# Patient Record
Sex: Male | Born: 1980 | Race: White | Hispanic: No | Marital: Single | Smoking: Former smoker
Health system: Southern US, Community
[De-identification: ages and names within clinical notes are randomized; demographics above are authoritative.]

## PROBLEM LIST (undated history)

## (undated) DIAGNOSIS — R7989 Other specified abnormal findings of blood chemistry: Secondary | ICD-10-CM

## (undated) DIAGNOSIS — E739 Lactose intolerance, unspecified: Secondary | ICD-10-CM

## (undated) DIAGNOSIS — F32A Depression, unspecified: Secondary | ICD-10-CM

## (undated) DIAGNOSIS — F419 Anxiety disorder, unspecified: Secondary | ICD-10-CM

## (undated) DIAGNOSIS — Z8619 Personal history of other infectious and parasitic diseases: Secondary | ICD-10-CM

## (undated) HISTORY — DX: Depression, unspecified: F32.A

## (undated) HISTORY — DX: Anxiety disorder, unspecified: F41.9

## (undated) HISTORY — PX: WISDOM TOOTH EXTRACTION: SHX21

## (undated) HISTORY — DX: Other specified abnormal findings of blood chemistry: R79.89

## (undated) HISTORY — DX: Lactose intolerance, unspecified: E73.9

## (undated) HISTORY — DX: Personal history of other infectious and parasitic diseases: Z86.19

---
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
## 2012-07-17 ENCOUNTER — Ambulatory Visit (INDEPENDENT_AMBULATORY_CARE_PROVIDER_SITE_OTHER): Payer: BC Managed Care – PPO | Admitting: Family

## 2012-07-17 ENCOUNTER — Encounter: Payer: Self-pay | Admitting: Family

## 2012-07-17 VITALS — BP 90/70 | HR 68 | Temp 98.1°F | Resp 16 | Ht 70.5 in | Wt 139.0 lb

## 2012-07-17 DIAGNOSIS — F329 Major depressive disorder, single episode, unspecified: Secondary | ICD-10-CM | POA: Insufficient documentation

## 2012-07-17 DIAGNOSIS — E785 Hyperlipidemia, unspecified: Secondary | ICD-10-CM

## 2012-07-17 DIAGNOSIS — K589 Irritable bowel syndrome without diarrhea: Secondary | ICD-10-CM | POA: Insufficient documentation

## 2012-07-17 DIAGNOSIS — F4321 Adjustment disorder with depressed mood: Secondary | ICD-10-CM

## 2012-07-17 DIAGNOSIS — R197 Diarrhea, unspecified: Secondary | ICD-10-CM

## 2012-07-17 NOTE — Assessment & Plan Note (Signed)
Will refer to a therapist to help him. He is instructed to go directly to the ED if he develops suicide ideation and to contact us if he thinks his symptoms are worsening.  Will hold off on meds for now.  30 minutes spent with pt today.  >50% of this time was spent counseling pt on his grief reaction.

## 2012-07-17 NOTE — Progress Notes (Signed)
Subjective:    Patient ID: Manuel Patton, male    DOB: Apr 09, 1981, 31 y.o.   MRN: [id number]  HPI  [name] "Manuel Patton" is a 31 yr old male who presents today to establish care.  He reports that he is originally from [place], [place], but moved down here 7 years ago for work.  He presents today with chief complaint of chronic diarrhea.   Diarrhea- reports + lactose intolerant since 14.  He reports that he keeps a strict lactose free diet, but despite this, he never has formed stools.  Diary will make his diarrhea worse. Beginning of this year he felt that his diarrhea symptoms worsened.  He reports associated  +Gas/Bloating.  Symptoms occur daily.  Denies associated nausea, reflux symptoms, black or bloody stools.    He reports that late last year his girlfriend diet unexpectedly in a motor vehicle accident.  He has been struggling more recently.  Feeling somewhat indifferent to things. Finding less pleasure in hobbies etc.   Hyperlipidemia-  Never on medicaiton.    Smokes 2 cigars a year.    Review of Systems  Constitutional: Negative for unexpected weight change.  HENT: Negative for congestion.   Eyes: Negative for visual disturbance.  Respiratory: Negative for cough and shortness of breath.   Cardiovascular: Negative for chest pain.  Gastrointestinal: Positive for diarrhea.  Genitourinary: Negative for dysuria and frequency.  Musculoskeletal: Positive for arthralgias. Negative for myalgias.       Bilateral shoulder pain, worse after exercise  Skin: Negative for rash.  Neurological: Negative for headaches.  Hematological: Negative for adenopathy.  Psychiatric/Behavioral:       +anxiety- had panic attack a few months ago   Past Medical History  Diagnosis Date  . History of chicken pox   . Hyperlipidemia   . Hypertension     History   Social History  . Marital Status: Single    Spouse Name: N/A    Number of Children: N/A  . Years of Education: N/A   Occupational History   . Not on file.   Social History Main Topics  . Smoking status: Current Some Day Smoker  . Smokeless tobacco: Never Used   Comment: 2 cigars a year  . Alcohol Use: 0.5 - 1.0 oz/week    1-2 drink(s) per week  . Drug Use: Not on file  . Sexually Active: Not on file   Other Topics Concern  . Not on file   Social History Narrative   Regular exercise:  Roller blade/skating 2-3 x weeklyCaffeine use:  Occasional tea.No children,Completed high schoolWorks for [employer]- insurance managementGirlfriend died 08-Apr-2011 in MVA    Past Surgical History  Procedure Date  . Tonsillectomy and adenoidectomy 04-07-2000    Family History  Problem Relation Age of Onset  . Stroke Maternal Grandmother   . Hypertension Other   . Hyperlipidemia Other     Only knows that htn and hyperlipidemia run  on his dad's side    No Known Allergies  No current outpatient prescriptions on file prior to visit.    BP 90/70  Pulse 68  Temp 98.1 F (36.7 C) (Oral)  Resp 16  Ht 5' 10.5" (1.791 m)  Wt 139 lb 0.6 oz (63.068 kg)  BMI 19.67 kg/m2  SpO2 99%       Objective:   Physical Exam  Constitutional: He appears well-developed.       Thin white mail.  Cardiovascular: Normal rate and regular rhythm.   No murmur  heard. Pulmonary/Chest: Effort normal and breath sounds normal. No respiratory distress. He has no wheezes. He has no rales. He exhibits no tenderness.  Musculoskeletal: He exhibits no edema.  Lymphadenopathy:    He has no cervical adenopathy.  Psychiatric: He has a normal mood and affect. His behavior is normal. Judgment and thought content normal.          Assessment & Plan:

## 2012-07-17 NOTE — Patient Instructions (Addendum)
You will be contact about your referrals to see the therapist and to the Gastroenterologist.  Please let us know if you have not heard back within 1 week about your referrals. Schedule a fasting physical at your convenience. Welcome to [facility]!

## 2012-07-17 NOTE — Assessment & Plan Note (Signed)
I suspect IBS, especially since it has worsened under stress.  Gluten intolerance is another possibility.  Will refer to GI for further evaluation. Recommended prn imodium for now.

## 2012-07-17 NOTE — Assessment & Plan Note (Signed)
Plan to check FLP next visit.

## 2012-07-30 ENCOUNTER — Ambulatory Visit (INDEPENDENT_AMBULATORY_CARE_PROVIDER_SITE_OTHER): Payer: BC Managed Care – PPO | Admitting: Family

## 2012-07-30 ENCOUNTER — Encounter: Payer: Self-pay | Admitting: Family

## 2012-07-30 VITALS — BP 100/74 | HR 64 | Temp 98.1°F | Resp 16 | Ht 70.5 in | Wt 135.0 lb

## 2012-07-30 DIAGNOSIS — F4321 Adjustment disorder with depressed mood: Secondary | ICD-10-CM

## 2012-07-30 DIAGNOSIS — R197 Diarrhea, unspecified: Secondary | ICD-10-CM

## 2012-07-30 DIAGNOSIS — Z0000 Encounter for general adult medical examination without abnormal findings: Secondary | ICD-10-CM

## 2012-07-30 LAB — CBC WITH DIFFERENTIAL/PLATELET
Basophils Absolute: 0 10*3/uL (ref 0.0–0.1)
Eosinophils Relative: 1 % (ref 0–5)
Lymphocytes Relative: 22 % (ref 12–46)
Lymphs Abs: 1.1 10*3/uL (ref 0.7–4.0)
MCV: 89 fL (ref 78.0–100.0)
Neutrophils Relative %: 70 % (ref 43–77)
Platelets: 311 10*3/uL (ref 150–400)
RBC: 4.99 MIL/uL (ref 4.22–5.81)
RDW: 12 % (ref 11.5–15.5)
WBC: 5.3 10*3/uL (ref 4.0–10.5)

## 2012-07-30 LAB — HEPATIC FUNCTION PANEL
ALT: 14 U/L (ref 0–53)
AST: 16 U/L (ref 0–37)
Bilirubin, Direct: 0.2 mg/dL (ref 0.0–0.3)
Indirect Bilirubin: 0.8 mg/dL (ref 0.0–0.9)
Total Protein: 7.6 g/dL (ref 6.0–8.3)

## 2012-07-30 LAB — LIPID PANEL
Cholesterol: 163 mg/dL (ref 0–200)
LDL Cholesterol: 97 mg/dL (ref 0–99)
Total CHOL/HDL Ratio: 3.2 Ratio
VLDL: 15 mg/dL (ref 0–40)

## 2012-07-30 NOTE — Progress Notes (Signed)
Subjective:    Patient ID: Manuel Patton, male    DOB: [birth year], 31 y.o.   MRN: [id number]  HPI  Patient presents today for complete physical.  Immunizations: declines flu shot.  Tetanus is up to date. Diet: not eating enough.   Exercise: Rollerblades [employer])  Review of Systems  Constitutional: Negative for unexpected weight change.  HENT: Negative for congestion.   Eyes: Negative for visual disturbance.  Respiratory: Negative for cough.   Cardiovascular: Negative for chest pain.  Gastrointestinal:       + diarrhea/bloating.  Genitourinary: Negative for frequency.  Musculoskeletal:       Some shoulder neck pain.    Skin: Negative for rash.  Neurological: Negative for headaches.  Hematological: Negative for adenopathy.  Psychiatric/Behavioral:       Fair mood   Past Medical History  Diagnosis Date  . History of chicken pox   . Hyperlipidemia   . Hypertension     History   Social History  . Marital Status: Single    Spouse Name: N/A    Number of Children: N/A  . Years of Education: N/A   Occupational History  . Not on file.   Social History Main Topics  . Smoking status: Current Some Day Smoker  . Smokeless tobacco: Never Used   Comment: 2 cigars a year  . Alcohol Use: 0.5 - 1.0 oz/week    1-2 drink(s) per week  . Drug Use: Not on file  . Sexually Active: Not on file   Other Topics Concern  . Not on file   Social History Narrative   Regular exercise:  Roller blade/skating 2-3 x weeklyCaffeine use:  Occasional tea.No children,Completed high schoolWorks for [employer]- insurance managementGirlfriend died 03-15-11 in MVA    Past Surgical History  Procedure Date  . Tonsillectomy and adenoidectomy 03/14/00    Family History  Problem Relation Age of Onset  . Stroke Maternal Grandmother   . Hypertension Other   . Hyperlipidemia Other     Only knows that htn and hyperlipidemia run  on his dad's side    No Known Allergies  No current outpatient  prescriptions on file prior to visit.    BP 100/74  Pulse 64  Temp 98.1 F (36.7 C) (Oral)  Resp 16  Ht 5' 10.5" (1.791 m)  Wt 135 lb 0.6 oz (61.254 kg)  BMI 19.10 kg/m2  SpO2 99%        Objective:   Physical Exam  Physical Exam  Constitutional: He is oriented to person, place, and time. He appears thin. No distress.  HENT:  Head: Normocephalic and atraumatic.  Right Ear: Tympanic membrane and ear canal normal.  Left Ear: Tympanic membrane and ear canal normal.  Mouth/Throat: Oropharynx is clear and moist.  Eyes: Pupils are equal, round, and reactive to light. No scleral icterus.  Neck: Normal range of motion. No thyromegaly present.  Cardiovascular: Normal rate and regular rhythm.   No murmur heard. Pulmonary/Chest: Effort normal and breath sounds normal. No respiratory distress. He has no wheezes. He has no rales. He exhibits no tenderness.  Abdominal: Soft. Bowel sounds are normal. He exhibits no distension and no mass. There is no tenderness. There is no rebound and no guarding.  Musculoskeletal: He exhibits no edema.  Lymphadenopathy:    He has no cervical adenopathy.  Neurological: He is alert and oriented to person, place, and time. He has normal reflexes. He exhibits normal muscle tone. Coordination normal.  Skin: Skin is warm  and dry.  Psychiatric: He has a normal mood and affect. His behavior is normal. Judgment and thought content normal.          Assessment & Plan:          Assessment & Plan:

## 2012-07-30 NOTE — Assessment & Plan Note (Signed)
Pt encouraged to exercise.  Obtain fasting lab work. Tetanus up to date.  Declines flu shot.

## 2012-07-30 NOTE — Assessment & Plan Note (Signed)
Unchanged. GI referral.

## 2012-07-30 NOTE — Patient Instructions (Addendum)
Please follow up in 3 months.  Complete your blood work prior to leaving.  

## 2012-07-31 ENCOUNTER — Encounter: Payer: Self-pay | Admitting: Family

## 2012-07-31 LAB — URINALYSIS, MICROSCOPIC ONLY
Casts: NONE SEEN
Squamous Epithelial / LPF: NONE SEEN

## 2012-07-31 LAB — BASIC METABOLIC PANEL WITH GFR
BUN: 12 mg/dL (ref 6–23)
CO2: 27 mEq/L (ref 19–32)
GFR, Est African American: >89 mL/min
Glucose, Bld: 84 mg/dL (ref 70–99)
Potassium: 4.1 mEq/L (ref 3.5–5.3)
Sodium: 141 mEq/L (ref 135–145)

## 2012-07-31 LAB — URINALYSIS, ROUTINE W REFLEX MICROSCOPIC
Leukocytes, UA: NEGATIVE
Nitrite: NEGATIVE
Protein, ur: NEGATIVE mg/dL
Specific Gravity, Urine: 1.028 (ref 1.005–1.030)
Urobilinogen, UA: 1 mg/dL (ref 0.0–1.0)

## 2012-08-01 ENCOUNTER — Encounter: Payer: Self-pay | Admitting: Internal Medicine

## 2012-08-22 ENCOUNTER — Ambulatory Visit (INDEPENDENT_AMBULATORY_CARE_PROVIDER_SITE_OTHER): Payer: BC Managed Care – PPO | Admitting: Licensed Clinical Social Worker

## 2012-08-22 DIAGNOSIS — F321 Major depressive disorder, single episode, moderate: Secondary | ICD-10-CM

## 2012-08-23 ENCOUNTER — Encounter: Payer: Self-pay | Admitting: Internal Medicine

## 2012-08-26 ENCOUNTER — Ambulatory Visit (INDEPENDENT_AMBULATORY_CARE_PROVIDER_SITE_OTHER): Payer: BC Managed Care – PPO | Admitting: Internal Medicine

## 2012-08-26 ENCOUNTER — Other Ambulatory Visit (INDEPENDENT_AMBULATORY_CARE_PROVIDER_SITE_OTHER): Payer: BC Managed Care – PPO

## 2012-08-26 ENCOUNTER — Encounter: Payer: Self-pay | Admitting: Internal Medicine

## 2012-08-26 VITALS — BP 108/62 | HR 75 | Ht 70.5 in | Wt 135.0 lb

## 2012-08-26 DIAGNOSIS — F4321 Adjustment disorder with depressed mood: Secondary | ICD-10-CM

## 2012-08-26 DIAGNOSIS — R109 Unspecified abdominal pain: Secondary | ICD-10-CM

## 2012-08-26 DIAGNOSIS — R197 Diarrhea, unspecified: Secondary | ICD-10-CM

## 2012-08-26 DIAGNOSIS — R195 Other fecal abnormalities: Secondary | ICD-10-CM

## 2012-08-26 DIAGNOSIS — R1030 Lower abdominal pain, unspecified: Secondary | ICD-10-CM

## 2012-08-26 LAB — IGA: IgA: 138 mg/dL (ref 68–378)

## 2012-08-26 MED ORDER — ALIGN PO CAPS: 1.0000 | ORAL_CAPSULE | Freq: Every day | ORAL | Status: DC

## 2012-08-26 NOTE — Patient Instructions (Signed)
Please follow up with Dr. Rhea Belton in 8-10 weeks  We have given you samples of Align. This puts good bacteria back into your colon. You should take 1 capsule by mouth once daily. If this works well for you, it can be purchased over the counter.  Please use Imodium over the counter as needed   Your physician has requested that you go to the basement for lab work before leaving today

## 2012-08-26 NOTE — Progress Notes (Signed)
Patient ID: Manuel Patton, male   DOB: Sep 20, 1981, 31 y.o.   MRN: [id number]  SUBJECTIVE: HPI Manuel Patton is a 31 yo male with little PMH other than lactose intolerance who seen in consultation at the request of Dr. Peggyann Juba for evaluation of lower abdominal cramping pain and loose stools. The patient states that he has a long history of lactose intolerance dating back to around age 65. He reports he follows a very strict lactose-free diet, and when and if he is exposed lactose he has lower abdominal cramping pain and diarrhea. He reports that he has had lower abdominal cramping pain and loose stools over the past year despite following a lactose-free diet. This is always worse after eating, but he is unaware of any particular trigger food. He does not have nocturnal symptoms. He has not noticed a major change in the frequency of his bowel movements, but rather a change in the consistency. He states his stools are also loose and occasionally liquid. There are times urgent. He has not seen any blood or melena. He does still experience lower abdominal cramping which is often relieved by bowel movement. This tends to be worse with stress. He did lose his girlfriend to motor vehicle accident in November of 2012.  He is recently seeing a therapist to discuss his grief and he reports after discussing it further he has noticed that his symptoms worsened around the time of her death. He also notes worsening anxiety since this time. He has occasionally use Pepto-Bismol but only on an as-needed basis. He has lost 8-10 pounds over the last year. No nausea or vomiting. Appetite has been fine. No heartburn, dysphagia or odynophagia. No rashes. No fever.  No camping or well water.  Review of Systems  As per history of present illness, otherwise negative   Past Medical History  Diagnosis Date  . History of chicken pox   . Lactose intolerance     Current Outpatient Prescriptions  Medication Sig Dispense Refill    . Bismuth Subsalicylate (PEPTO-BISMOL PO) Take by mouth as needed.        No Known Allergies  Family History  Problem Relation Age of Onset  . Stroke Maternal Grandmother   . Hypertension Other   . Hyperlipidemia Other     Only knows that htn and hyperlipidemia run  on his dad's side    History  Substance Use Topics  . Smoking status: Current Some Day Smoker  . Smokeless tobacco: Never Used   Comment: 2 cigars a year  . Alcohol Use: 0.5 - 1.0 oz/week    1-2 drink(s) per week    OBJECTIVE: BP 108/62  Pulse 75  Ht 5' 10.5" (1.791 m)  Wt 135 lb (61.236 kg)  BMI 19.10 kg/m2 Constitutional: Well-developed and well-nourished. No distress. HEENT: Normocephalic and atraumatic. Oropharynx is clear and moist. No oropharyngeal exudate. Conjunctivae are normal. No scleral icterus. Neck: Neck supple. Trachea midline. Cardiovascular: Normal rate, regular rhythm and intact distal pulses. No M/R/G Pulmonary/chest: Effort normal and breath sounds normal. No wheezing, rales or rhonchi. Abdominal: Soft, nontender, nondistended. Bowel sounds active throughout. There are no masses palpable. No hepatosplenomegaly. Extremities: no clubbing, cyanosis, or edema Lymphadenopathy: No cervical adenopathy noted. Neurological: Alert and oriented to person place and time. Skin: Skin is warm and dry. No rashes noted. Psychiatric: Normal mood and affect. Behavior is normal.  Labs and Imaging -- TSH normal  CBC    Component Value Date/Time   WBC 5.3 07/30/2012 0922  RBC 4.99 07/30/2012 0922   HGB 15.7 07/30/2012 0922   HCT 44.4 07/30/2012 0922   PLT 311 07/30/2012 0922   MCV 89.0 07/30/2012 0922   MCH 31.5 07/30/2012 0922   MCHC 35.4 07/30/2012 0922   RDW 12.0 07/30/2012 0922   LYMPHSABS 1.1 07/30/2012 0922   MONOABS 0.3 07/30/2012 0922   EOSABS 0.0 07/30/2012 0922   BASOSABS 0.0 07/30/2012 0922    CMP     Component Value Date/Time   NA 141 07/30/2012 0922   K 4.1 07/30/2012 0922   CL 102  07/30/2012 0922   CO2 27 07/30/2012 0922   GLUCOSE 84 07/30/2012 0922   BUN 12 07/30/2012 0922   CREATININE 1.14 07/30/2012 0922   CALCIUM 10.0 07/30/2012 0922   PROT 7.6 07/30/2012 0922   ALBUMIN 4.8 07/30/2012 0922   AST 16 07/30/2012 0922   ALT 14 07/30/2012 0922   ALKPHOS 53 07/30/2012 0922   BILITOT 1.0 07/30/2012 0922    ASSESSMENT AND PLAN: 31 yo male with little PMH other than lactose intolerance who seen in consultation at the request of Dr. Peggyann Juba for evaluation of lower abdominal cramping pain and loose stools.  1.  Loose stools/cramping lower abdominal pain -- the patient's symptoms may in fact be irritable bowel related, but I would also like to rule out celiac disease. His thyroid function has been checked and is normal. This does not sound suspicious for infectious or inflammatory etiology.  He feels that closely relates to stress, which would fit more than irritable type phenomenon. It does seem he has a lactose intolerance and has been treating this with lactose avoidance. I would like for him to give a trial of Align one capsule daily as a probiotic. I've also recommended the use of loperamide 2-4 mg on an as-needed basis for loose stools and diarrhea. He is aware that a max dose of 16 mg daily, but I do not think he would need anywhere near the max dose to get benefit from this med. I will see him back in 8-10 weeks or sooner if necessary. Also feel that he will likely benefit from ongoing therapy for [facility], which is planned.  2.  Lactose intolerance -- chronic, he does well avoiding lactose-containing foods. See #1

## 2012-09-02 ENCOUNTER — Ambulatory Visit (INDEPENDENT_AMBULATORY_CARE_PROVIDER_SITE_OTHER): Payer: BC Managed Care – PPO | Admitting: Licensed Clinical Social Worker

## 2012-09-02 DIAGNOSIS — F321 Major depressive disorder, single episode, moderate: Secondary | ICD-10-CM

## 2012-09-09 ENCOUNTER — Ambulatory Visit (INDEPENDENT_AMBULATORY_CARE_PROVIDER_SITE_OTHER): Payer: BC Managed Care – PPO | Admitting: Licensed Clinical Social Worker

## 2012-09-09 DIAGNOSIS — F321 Major depressive disorder, single episode, moderate: Secondary | ICD-10-CM

## 2012-09-18 ENCOUNTER — Ambulatory Visit (INDEPENDENT_AMBULATORY_CARE_PROVIDER_SITE_OTHER): Payer: BC Managed Care – PPO | Admitting: Licensed Clinical Social Worker

## 2012-09-18 DIAGNOSIS — F321 Major depressive disorder, single episode, moderate: Secondary | ICD-10-CM

## 2012-10-07 ENCOUNTER — Ambulatory Visit (INDEPENDENT_AMBULATORY_CARE_PROVIDER_SITE_OTHER): Payer: BC Managed Care – PPO | Admitting: Licensed Clinical Social Worker

## 2012-10-07 DIAGNOSIS — F321 Major depressive disorder, single episode, moderate: Secondary | ICD-10-CM

## 2012-10-21 ENCOUNTER — Ambulatory Visit (INDEPENDENT_AMBULATORY_CARE_PROVIDER_SITE_OTHER): Payer: BC Managed Care – PPO | Admitting: Licensed Clinical Social Worker

## 2012-10-21 DIAGNOSIS — F321 Major depressive disorder, single episode, moderate: Secondary | ICD-10-CM

## 2012-10-28 ENCOUNTER — Encounter: Payer: Self-pay | Admitting: Internal Medicine

## 2012-11-01 ENCOUNTER — Encounter: Payer: Self-pay | Admitting: Internal Medicine

## 2012-11-01 ENCOUNTER — Ambulatory Visit (INDEPENDENT_AMBULATORY_CARE_PROVIDER_SITE_OTHER): Payer: BC Managed Care – PPO | Admitting: Internal Medicine

## 2012-11-01 VITALS — BP 100/70 | HR 64 | Ht 70.5 in | Wt 137.1 lb

## 2012-11-01 DIAGNOSIS — K589 Irritable bowel syndrome without diarrhea: Secondary | ICD-10-CM

## 2012-11-01 DIAGNOSIS — R109 Unspecified abdominal pain: Secondary | ICD-10-CM

## 2012-11-01 DIAGNOSIS — R195 Other fecal abnormalities: Secondary | ICD-10-CM

## 2012-11-01 MED ORDER — HYOSCYAMINE SULFATE 0.125 MG SL SUBL: 0.1250 mg | SUBLINGUAL_TABLET | SUBLINGUAL | Status: DC | PRN

## 2012-11-01 NOTE — Patient Instructions (Addendum)
We have sent the following medications to your pharmacy for you to pick up at your convenience: levsin; please take as directed.  Continue taking Imodium as needed and Align daily.  Follow up with Dr. Rhea Belton as needed or if symptoms worsen

## 2012-11-01 NOTE — Progress Notes (Signed)
Subjective:    Patient ID: Manuel Patton, male    DOB: 07-25-1981, 32 y.o.   MRN: [id number]  HPI [name] is a 32 yo male with PMH of lactose intolerance who seen in followup for loose stool and lower abdominal cramping. He was last seen in late October 2013, and he returns alone today for followup. He started align one capsule daily and is used as needed Imodium to help control his loose stools. He's also continued to look for foods that may trigger his symptoms. He feels that with the addition of align he's had decreasing frequency of loose stools but he still has unpredictable episodes. Occasionally he does have lower and left sided abdominal cramping which is usually relieved by defecation. He has not seen any blood in his stool or melena. On most days he only has one bowel movement, but on other days, which he considers to be unpredictable he will have several loose stools.  fevers or chills. His weight has been stable, in fact up 2 pounds since last visit.  He continues to avoid lactose. He does ask whether SSRI therapy would interfere with his IBS in any way. He continues to see a mental health provider to discuss his grief and possible depression.  He is considering with his therapist the addition of an SSRI to help in his mood. He continues to work and function well.  No SI/HI  Of note thyroid function and celiac panel were normal/negative   Review of Systems As per history of present illness, otherwise negative  Current Medications, Allergies, Past Medical History, Past Surgical History, Family History and Social History were reviewed in [facility] record.     Objective:   Physical Exam BP 100/70  Pulse 64  Ht 5' 10.5" (1.791 m)  Wt 137 lb 2 oz (62.199 kg)  BMI 19.40 kg/m2 Constitutional: Well-developed and well-nourished. No distress. Neurological: Alert and oriented to person place and time. Psychiatric: Normal mood and affect. Behavior is  normal.  Celiac panel negative TSH normal  CBC    Component Value Date/Time   WBC 5.3 07/30/2012 0922   RBC 4.99 07/30/2012 0922   HGB 15.7 07/30/2012 0922   HCT 44.4 07/30/2012 0922   PLT 311 07/30/2012 0922   MCV 89.0 07/30/2012 0922   MCH 31.5 07/30/2012 0922   MCHC 35.4 07/30/2012 0922   RDW 12.0 07/30/2012 0922   LYMPHSABS 1.1 07/30/2012 0922   MONOABS 0.3 07/30/2012 0922   EOSABS 0.0 07/30/2012 0922   BASOSABS 0.0 07/30/2012 0922   CMP     Component Value Date/Time   NA 141 07/30/2012 0922   K 4.1 07/30/2012 0922   CL 102 07/30/2012 0922   CO2 27 07/30/2012 0922   GLUCOSE 84 07/30/2012 0922   BUN 12 07/30/2012 0922   CREATININE 1.14 07/30/2012 0922   CALCIUM 10.0 07/30/2012 0922   PROT 7.6 07/30/2012 0922   ALBUMIN 4.8 07/30/2012 0922   AST 16 07/30/2012 0922   ALT 14 07/30/2012 0922   ALKPHOS 53 07/30/2012 0922   BILITOT 1.0 07/30/2012 0922      Assessment & Plan:   32 yo male with PMH of lactose intolerance who seen in followup for loose stool and lower abdominal cramping most consistent with IBS  1.  IBS - loose stool predominant -- his symptoms remain most consistent with irritable bowel. He is without alarm symptom.  We discussed overall management of irritable bowel and I would like for him  to continue the align one capsule daily. He can also use Imodium on an as-needed basis to help control his loose stools. He is made aware that he can use this medication preemptively if he has a certain activity where he must avoid having diarrhea.  I LAD Levsin to be used as needed and as directed for lower abdominal cramping/spasm. I have discussed how occasionally I use SSRI therapy for IBS patients, and [place] the addition of this medication may in fact help (rather than harm or worsen) his symptoms.  He is asked to notify me should his symptoms change or worsen or he become concerned in anyway. He voices understanding Followup as needed

## 2012-11-05 ENCOUNTER — Ambulatory Visit: Payer: BC Managed Care – PPO | Admitting: Family Medicine

## 2012-11-05 ENCOUNTER — Encounter: Payer: Self-pay | Admitting: Family

## 2012-11-05 ENCOUNTER — Ambulatory Visit (INDEPENDENT_AMBULATORY_CARE_PROVIDER_SITE_OTHER): Payer: BC Managed Care – PPO | Admitting: Family Medicine

## 2012-11-05 ENCOUNTER — Ambulatory Visit (INDEPENDENT_AMBULATORY_CARE_PROVIDER_SITE_OTHER): Payer: BC Managed Care – PPO | Admitting: Family

## 2012-11-05 ENCOUNTER — Encounter: Payer: Self-pay | Admitting: Family Medicine

## 2012-11-05 VITALS — BP 123/81 | HR 74 | Ht 71.0 in | Wt 137.0 lb

## 2012-11-05 VITALS — BP 116/64 | HR 59 | Temp 97.4°F | Resp 16 | Ht 70.5 in | Wt 136.0 lb

## 2012-11-05 DIAGNOSIS — M436 Torticollis: Secondary | ICD-10-CM

## 2012-11-05 DIAGNOSIS — M25512 Pain in left shoulder: Secondary | ICD-10-CM | POA: Insufficient documentation

## 2012-11-05 DIAGNOSIS — F341 Dysthymic disorder: Secondary | ICD-10-CM

## 2012-11-05 DIAGNOSIS — M25519 Pain in unspecified shoulder: Secondary | ICD-10-CM

## 2012-11-05 DIAGNOSIS — F329 Major depressive disorder, single episode, unspecified: Secondary | ICD-10-CM

## 2012-11-05 DIAGNOSIS — M542 Cervicalgia: Secondary | ICD-10-CM

## 2012-11-05 DIAGNOSIS — M25511 Pain in right shoulder: Secondary | ICD-10-CM

## 2012-11-05 MED ORDER — SERTRALINE HCL 50 MG PO TABS: 50.0000 mg | ORAL_TABLET | Freq: Every day | ORAL | Status: DC

## 2012-11-05 NOTE — Progress Notes (Signed)
Subjective:    Patient ID: Manuel Patton, male    DOB: Mar 19, 1981, 32 y.o.   MRN: [id number]  HPI  "Manuel Patton" presents today for follow up.  He continues to struggle with the loss of his girlfriend 13 months ago. Reports difficulty falling asleep unless he takes Unisom.  He uses Unisom on Friday and 2023/04/03 nights and then sleeps into the afternoon.  Finds himself avoiding interaction with others.  Trouble getting out of bed in the morning.  Goes in early to work and often stays late to "stay busy." He continues to work weekly with a therapist which he reports has been "thought provoking" but hasn't help a whole lot. He denies suicide ideation.  Neck stiffness/pain. This is chronic.  Reports some trouble turing his head toward the left shoulder.     Bilateral shoulder pain. Has seeb [place] [facility] 3-4 years ago and completed PT which "helped at the time." No known injury, reports "popping" and "crunching" in shoulder.    Review of Systems See HPI  Past Medical History  Diagnosis Date  . History of chicken pox   . Lactose intolerance     History   Social History  . Marital Status: Single    Spouse Name: N/A    Number of Children: N/A  . Years of Education: N/A   Occupational History  . Not on file.   Social History Main Topics  . Smoking status: Current Some Day Smoker  . Smokeless tobacco: Never Used     Comment: 2 cigars a year  . Alcohol Use: 0.5 - 1.0 oz/week    1-2 drink(s) per week  . Drug Use: No  . Sexually Active: Not on file   Other Topics Concern  . Not on file   Social History Narrative   Regular exercise:  Roller blade/skating 2-3 x weeklyCaffeine use:  Occasional tea.No children,Completed high schoolWorks for [employer]- insurance managementGirlfriend died April 03, 2011 in MVA    Past Surgical History  Procedure Date  . Tonsillectomy and adenoidectomy 04/02/2000  . Wisdom tooth extraction     Family History  Problem Relation Age of Onset  . Stroke Maternal  Grandmother   . Hypertension Other   . Hyperlipidemia Other     Only knows that htn and hyperlipidemia run  on his dad's side    No Known Allergies  Current Outpatient Prescriptions on File Prior to Visit  Medication Sig Dispense Refill  . bifidobacterium infantis (ALIGN) capsule Take 1 capsule by mouth daily.  14 capsule  0  . hyoscyamine (LEVSIN/SL) 0.125 MG SL tablet Place 1 tablet (0.125 mg total) under the tongue every 4 (four) hours as needed for cramping.  30 tablet  0  . Loperamide HCl (IMODIUM A-D PO) Take 1 tablet by mouth as needed.      . sertraline (ZOLOFT) 50 MG tablet Take 1 tablet (50 mg total) by mouth daily.  30 tablet  0    BP 116/64  Pulse 59  Temp 97.4 F (36.3 C) (Oral)  Resp 16  Ht 5' 10.5" (1.791 m)  Wt 136 lb (61.689 kg)  BMI 19.24 kg/m2  SpO2 99%       Objective:   Physical Exam  Constitutional: He is oriented to person, place, and time. He appears well-developed and well-nourished. No distress.  HENT:  Head: Normocephalic and atraumatic.  Cardiovascular: Normal rate and regular rhythm.   No murmur heard. Pulmonary/Chest: Effort normal and breath sounds normal. No respiratory distress. He has  no wheezes. He has no rales. He exhibits no tenderness.  Musculoskeletal:       Full ROM of bilateral shoulders. Bilateral crepitus is noted.  Neurological: He is alert and oriented to person, place, and time.  Psychiatric: He has a normal mood and affect. His behavior is normal. Judgment and thought content normal.          Assessment & Plan:  30 minutes spent with pt today.  >50% of this time was spent counseling pt on his depression.

## 2012-11-05 NOTE — Assessment & Plan Note (Signed)
Deteriorated.   He is ready to try medication.  Will give trial of Zoloft.  I instructed pt to start 1/2 tablet once daily for 1 week and then increase to a full tablet once daily on week two as tolerated.  We discussed common side effects such as nausea, drowsiness and weight gain.  Also discussed rare but serious side effect of suicide ideation.  She is instructed to discontinue medication go directly to ED if this occurs.  Pt verbalizes understanding.  Plan follow up in 1 month to evaluate progress.  Pt will continue to work with therapist.

## 2012-11-05 NOTE — Patient Instructions (Addendum)
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 Aleve and/or tylenol as needed for pain. Subacromial injection may be beneficial to help with pain and to decrease inflammation if you're not improving. Start physical therapy with transition to home exercise program. Do home exercise program with theraband and scapular stabilization exercises daily - these are very important for long term relief even if an injection was given. If not improving at follow-up we will consider further imaging.  You have a cervical strain. Take tylenol for baseline pain relief (1-2 extra strength tabs 3x/day) Aleve 2 tabs twice a day as needed with food for pain and inflammation. Consider muscle relaxant, pain medicine if needed. Stay as active as possible. Do home exercises and stretches as directed - hold each for 20-30 seconds and do each one three times. Consider massage, chiropractor, physical therapy, and/or acupuncture. Physical therapy has been shown to be helpful while the others have mixed results. Strengthening of low back muscles, abdominal musculature are key for long term pain relief. If not improving, will consider further imaging (MRI).  Follow up with me in 6 weeks for reevaluation.

## 2012-11-05 NOTE — Assessment & Plan Note (Signed)
Order X-ray of C spine.

## 2012-11-05 NOTE — Patient Instructions (Addendum)
Complete your x ray on the first floor. You will be contacted about your referral to sports medicine. Please let us know if you have not heard back within 1 week about your referral. Zoloft- 1/2 tablet once daily for 1 week, then increase to a full tab daily on second week.  Follow up in 1 month.

## 2012-11-05 NOTE — Assessment & Plan Note (Signed)
Will refer to sports medicine.  

## 2012-11-06 ENCOUNTER — Encounter: Payer: Self-pay | Admitting: Family Medicine

## 2012-11-06 ENCOUNTER — Ambulatory Visit (INDEPENDENT_AMBULATORY_CARE_PROVIDER_SITE_OTHER): Payer: BC Managed Care – PPO | Admitting: Licensed Clinical Social Worker

## 2012-11-06 DIAGNOSIS — F321 Major depressive disorder, single episode, moderate: Secondary | ICD-10-CM

## 2012-11-06 NOTE — Assessment & Plan Note (Signed)
2/2 cervical strain - PT for this as well.  Declined muscle relaxant.  Nsaids, tylenol, home exercises.  F/u in 6 weeks for both issues.

## 2012-11-06 NOTE — Assessment & Plan Note (Signed)
2/2 multidirectional instability and impingement.  Start with formal PT, nsaids, tylenol, avoid painful motions as much as possible.  If not improving consider cortisone injection, nitro patches, further imaging.

## 2012-11-06 NOTE — Progress Notes (Signed)
Subjective:    Patient ID: Manuel Patton, male    DOB: Sep 15, 1981, 32 y.o.   MRN: [id number]  PCP: Sandford Craze  HPI 32 yo M here for bilateral shoulder pain, left neck pain.  Patient denies known injury. States about 3 years ago he believes pain started in left side of neck, shoulders when he was doing heavier lifting in the gym but no pop, tear noted at the time. Pain worsened but seemed to get better with rest. Shooting pain has resolved but still gets pain with specific motions like reaching, overhead. Some popping and clicking heard/felt. Did physical therapy back then which helped. Left side of neck has been bothering the past couple months. No numbness or tingling. No radiation into arms. No bowel/bladder dysfunction.  Past Medical History  Diagnosis Date  . History of chicken pox   . Lactose intolerance     Current Outpatient Prescriptions on File Prior to Visit  Medication Sig Dispense Refill  . bifidobacterium infantis (ALIGN) capsule Take 1 capsule by mouth daily.  14 capsule  0  . hyoscyamine (LEVSIN/SL) 0.125 MG SL tablet Place 1 tablet (0.125 mg total) under the tongue every 4 (four) hours as needed for cramping.  30 tablet  0  . Loperamide HCl (IMODIUM A-D PO) Take 1 tablet by mouth as needed.      . sertraline (ZOLOFT) 50 MG tablet Take 1 tablet (50 mg total) by mouth daily.  30 tablet  0    Past Surgical History  Procedure Date  . Tonsillectomy and adenoidectomy 2000-03-29  . Wisdom tooth extraction     No Known Allergies  History   Social History  . Marital Status: Single    Spouse Name: N/A    Number of Children: N/A  . Years of Education: N/A   Occupational History  . Not on file.   Social History Main Topics  . Smoking status: Current Some Day Smoker  . Smokeless tobacco: Never Used     Comment: 2 cigars a year  . Alcohol Use: 0.5 - 1.0 oz/week    1-2 drink(s) per week  . Drug Use: No  . Sexually Active: Not on file   Other  Topics Concern  . Not on file   Social History Narrative   Regular exercise:  Roller blade/skating 2-3 x weeklyCaffeine use:  Occasional tea.No children,Completed high schoolWorks for [employer]- insurance managementGirlfriend died 2011-03-30 in MVA    Family History  Problem Relation Age of Onset  . Stroke Maternal Grandmother   . Hypertension Other   . Hyperlipidemia Other     Only knows that htn and hyperlipidemia run  on his dad's side  . Hyperlipidemia Father   . Heart attack Neg Hx   . Diabetes Neg Hx   . Sudden death Neg Hx     BP 123/81  Pulse 74  Ht 5\' 11"  (1.803 m)  Wt 137 lb (62.143 kg)  BMI 19.11 kg/m2  Review of Systems See HPI above.    Objective:   Physical Exam Gen: NAD  Neck: No gross deformity, swelling, bruising.  Spasms left paraspinal cervical region and trapezius. TTP in spasmed area noted above.  No midline/bony TTP. FROM neck - pain with extension and left lat rotation. BUE strength 5/5.   Sensation intact to light touch.   2+ equal reflexes in triceps, biceps, brachioradialis tendons. Negative spurlings. NV intact distal BUEs.  Bilateral shoulders: No swelling, ecchymoses.  No gross deformity. No TTP. FROM with  mild painful arc Greater than expect ER and IR. Positive Hawkins, Neers. Negative Speeds, Yergasons. Strength 5/5 with empty can and resisted internal/external rotation. Negative apprehension. Positive sulcus signs. NV intact distally.    Assessment & Plan:  1. Bilateral shoulder pain - 2/2 multidirectional instability and impingement.  Start with formal PT, nsaids, tylenol, avoid painful motions as much as possible.  If not improving consider cortisone injection, nitro patches, further imaging.    2. Neck pain - 2/2 cervical strain - PT for this as well.  Declined muscle relaxant.  Nsaids, tylenol, home exercises.  F/u in 6 weeks for both issues.

## 2012-11-11 ENCOUNTER — Ambulatory Visit: Payer: BC Managed Care – PPO | Admitting: Family Medicine

## 2012-11-12 ENCOUNTER — Ambulatory Visit: Payer: BC Managed Care – PPO | Attending: Family Medicine | Admitting: Physical Therapy

## 2012-11-12 ENCOUNTER — Ambulatory Visit (HOSPITAL_BASED_OUTPATIENT_CLINIC_OR_DEPARTMENT_OTHER)
Admission: RE | Admit: 2012-11-12 | Discharge: 2012-11-12 | Disposition: A | Discharge status: Home / Self Care | Payer: BC Managed Care – PPO | Source: Ambulatory Visit | Attending: Family | Admitting: Family

## 2012-11-12 DIAGNOSIS — M25519 Pain in unspecified shoulder: Secondary | ICD-10-CM | POA: Insufficient documentation

## 2012-11-12 DIAGNOSIS — M542 Cervicalgia: Secondary | ICD-10-CM

## 2012-11-12 DIAGNOSIS — IMO0001 Reserved for inherently not codable concepts without codable children: Secondary | ICD-10-CM | POA: Insufficient documentation

## 2012-11-14 ENCOUNTER — Ambulatory Visit: Payer: BC Managed Care – PPO | Admitting: Physical Therapy

## 2012-11-18 ENCOUNTER — Ambulatory Visit: Payer: BC Managed Care – PPO | Admitting: Physical Therapy

## 2012-11-20 ENCOUNTER — Ambulatory Visit: Payer: BC Managed Care – PPO | Admitting: Physical Therapy

## 2012-11-27 ENCOUNTER — Ambulatory Visit (INDEPENDENT_AMBULATORY_CARE_PROVIDER_SITE_OTHER): Payer: BC Managed Care – PPO | Admitting: Licensed Clinical Social Worker

## 2012-11-27 DIAGNOSIS — F321 Major depressive disorder, single episode, moderate: Secondary | ICD-10-CM

## 2012-12-03 ENCOUNTER — Ambulatory Visit (INDEPENDENT_AMBULATORY_CARE_PROVIDER_SITE_OTHER): Payer: BC Managed Care – PPO | Admitting: Family

## 2012-12-03 ENCOUNTER — Encounter: Payer: Self-pay | Admitting: Family

## 2012-12-03 VITALS — BP 100/70 | HR 70 | Temp 97.7°F | Resp 16 | Ht 70.5 in | Wt 135.0 lb

## 2012-12-03 DIAGNOSIS — L709 Acne, unspecified: Secondary | ICD-10-CM | POA: Insufficient documentation

## 2012-12-03 DIAGNOSIS — L708 Other acne: Secondary | ICD-10-CM

## 2012-12-03 DIAGNOSIS — F341 Dysthymic disorder: Secondary | ICD-10-CM

## 2012-12-03 DIAGNOSIS — L719 Rosacea, unspecified: Secondary | ICD-10-CM | POA: Insufficient documentation

## 2012-12-03 DIAGNOSIS — F329 Major depressive disorder, single episode, unspecified: Secondary | ICD-10-CM

## 2012-12-03 MED ORDER — BENZOYL PEROXIDE-ERYTHROMYCIN 5-3 % EX GEL: Freq: Two times a day (BID) | CUTANEOUS | Status: DC

## 2012-12-03 MED ORDER — SERTRALINE HCL 50 MG PO TABS: 50.0000 mg | ORAL_TABLET | Freq: Every day | ORAL | Status: DC

## 2012-12-03 NOTE — Progress Notes (Signed)
Subjective:    Patient ID: Manuel Patton, male    DOB: October 10, 1981, 32 y.o.   MRN: [id number]  HPI  [name] is a 32 yr old male who presents today for follow up of his depression.  Last visit he was started on zoloft 50mg .  He reports tolerating this medication without side effects.  Reports that he has noticed some improvement in his depression.  Feels like he is less likely to try to stay away from others but notes that he is "still not putting myself in the middle of things."  Acne- reports "painful bumps" that come up on his face.  Uses proactiv without improvement. Would like referral to dermatology.   Review of Systems    see HPI  Past Medical History  Diagnosis Date  . History of chicken pox   . Lactose intolerance     History   Social History  . Marital Status: Single    Spouse Name: N/A    Number of Children: N/A  . Years of Education: N/A   Occupational History  . Not on file.   Social History Main Topics  . Smoking status: Current Some Day Smoker  . Smokeless tobacco: Never Used     Comment: 2 cigars a year  . Alcohol Use: 0.5 - 1.0 oz/week    1-2 drink(s) per week  . Drug Use: No  . Sexually Active: Not on file   Other Topics Concern  . Not on file   Social History Narrative   Regular exercise:  Roller blade/skating 2-3 x weeklyCaffeine use:  Occasional tea.No children,Completed high schoolWorks for [employer]- insurance managementGirlfriend died 2011-03-23 in MVA    Past Surgical History  Procedure Date  . Tonsillectomy and adenoidectomy 03-22-2000  . Wisdom tooth extraction     Family History  Problem Relation Age of Onset  . Stroke Maternal Grandmother   . Hypertension Other   . Hyperlipidemia Other     Only knows that htn and hyperlipidemia run  on his dad's side  . Hyperlipidemia Father   . Heart attack Neg Hx   . Diabetes Neg Hx   . Sudden death Neg Hx     No Known Allergies  Current Outpatient Prescriptions on File Prior to Visit   Medication Sig Dispense Refill  . bifidobacterium infantis (ALIGN) capsule Take 1 capsule by mouth daily.  14 capsule  0  . hyoscyamine (LEVSIN/SL) 0.125 MG SL tablet Place 1 tablet (0.125 mg total) under the tongue every 4 (four) hours as needed for cramping.  30 tablet  0  . Loperamide HCl (IMODIUM A-D PO) Take 1 tablet by mouth as needed.      . sertraline (ZOLOFT) 50 MG tablet Take 1 tablet (50 mg total) by mouth daily.  30 tablet  2    BP 100/70  Pulse 70  Temp 97.7 F (36.5 C) (Oral)  Resp 16  Ht 5' 10.5" (1.791 m)  Wt 135 lb (61.236 kg)  BMI 19.10 kg/m2  SpO2 99%    Objective:   Physical Exam  Constitutional: He is oriented to person, place, and time. He appears well-developed and well-nourished. No distress.  Musculoskeletal: He exhibits no edema.  Neurological: He is alert and oriented to person, place, and time.  Skin:       Acne noted on chin  Psychiatric: He has a normal mood and affect. His behavior is normal. Judgment and thought content normal.  Assessment & Plan:

## 2012-12-03 NOTE — Patient Instructions (Addendum)
Please schedule a follow up appointment in 3 months. You will be contacted about your referral to dermatology.  Please let us know if you have not heard back within 1 week about your referral.

## 2012-12-03 NOTE — Assessment & Plan Note (Signed)
Will give trial of benzamicin gel.  Refer to dermatology

## 2012-12-03 NOTE — Assessment & Plan Note (Signed)
Improved. Continue zoloft.  

## 2012-12-04 ENCOUNTER — Ambulatory Visit: Payer: BC Managed Care – PPO | Attending: Family | Admitting: Physical Therapy

## 2012-12-04 DIAGNOSIS — M25519 Pain in unspecified shoulder: Secondary | ICD-10-CM | POA: Insufficient documentation

## 2012-12-04 DIAGNOSIS — M542 Cervicalgia: Secondary | ICD-10-CM | POA: Insufficient documentation

## 2012-12-04 DIAGNOSIS — IMO0001 Reserved for inherently not codable concepts without codable children: Secondary | ICD-10-CM | POA: Insufficient documentation

## 2012-12-05 ENCOUNTER — Ambulatory Visit: Payer: BC Managed Care – PPO | Admitting: Physical Therapy

## 2012-12-10 ENCOUNTER — Ambulatory Visit: Payer: BC Managed Care – PPO | Admitting: Physical Therapy

## 2012-12-11 ENCOUNTER — Ambulatory Visit (INDEPENDENT_AMBULATORY_CARE_PROVIDER_SITE_OTHER): Payer: BC Managed Care – PPO | Admitting: Licensed Clinical Social Worker

## 2012-12-11 DIAGNOSIS — F321 Major depressive disorder, single episode, moderate: Secondary | ICD-10-CM

## 2012-12-12 ENCOUNTER — Ambulatory Visit: Payer: BC Managed Care – PPO | Admitting: Physical Therapy

## 2012-12-17 ENCOUNTER — Ambulatory Visit: Payer: BC Managed Care – PPO | Admitting: Family Medicine

## 2012-12-18 ENCOUNTER — Ambulatory Visit: Payer: BC Managed Care – PPO | Admitting: Rehabilitation

## 2012-12-25 ENCOUNTER — Ambulatory Visit: Payer: BC Managed Care – PPO | Admitting: Licensed Clinical Social Worker

## 2013-03-04 ENCOUNTER — Encounter: Payer: Self-pay | Admitting: Family

## 2013-03-04 ENCOUNTER — Ambulatory Visit (INDEPENDENT_AMBULATORY_CARE_PROVIDER_SITE_OTHER): Payer: BC Managed Care – PPO | Admitting: Family

## 2013-03-04 VITALS — BP 100/70 | HR 74 | Temp 97.8°F | Resp 16 | Ht 70.5 in

## 2013-03-04 DIAGNOSIS — F329 Major depressive disorder, single episode, unspecified: Secondary | ICD-10-CM

## 2013-03-04 DIAGNOSIS — F341 Dysthymic disorder: Secondary | ICD-10-CM

## 2013-03-04 NOTE — Assessment & Plan Note (Addendum)
Stable.  I have asked him to let me know if he has any unusual spending sprees or erratic behaviors. f this occurs, will plan referral to psychiatry.    He agrees.  He denies family hx of bipolar disorder.  Plan follow up in 2 months.

## 2013-03-04 NOTE — Progress Notes (Signed)
Subjective:    Patient ID: Manuel Patton, male    DOB: [birth year], 32 y.o.   MRN: [id number]  HPI  Mr.  Manuel Patton is a 32 yr old male who presents today for follow up of his depression. He reports generally feeling well.  He reports that he is engaging more with others.  The only side effect that he has from the the zoloft is low libido.  He tells me that this is not a concern for him right now. He reports that he has depressive symptoms about 2 day a month. Denies associated suicide ideation.  He reports that on two occasions he has "rented really expensive cars" for the weekend.  Went to see a therapist which seemed to help.   Review of Systems    see HPI  Past Medical History  Diagnosis Date  . History of chicken pox   . Lactose intolerance     History   Social History  . Marital Status: Single    Spouse Name: N/A    Number of Children: N/A  . Years of Education: N/A   Occupational History  . Not on file.   Social History Main Topics  . Smoking status: Current Some Day Smoker  . Smokeless tobacco: Never Used     Comment: 2 cigars a year  . Alcohol Use: .5 - 1 oz/week    1-2 drink(s) per week  . Drug Use: No  . Sexually Active: Not on file   Other Topics Concern  . Not on file   Social History Narrative   Regular exercise:  Roller blade/skating 2-3 x weekly   Caffeine use:  Occasional tea.   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 Tonsillectomy and adenoidectomy  Mar 16, 2000  . Wisdom tooth extraction      Family History  Problem Relation Age of Onset  . Stroke Maternal Grandmother   . Hypertension Other   . Hyperlipidemia Other     Only knows that htn and hyperlipidemia run  on his dad's side  . Hyperlipidemia Father   . Heart attack Neg Hx   . Diabetes Neg Hx   . Sudden death Neg Hx     No Known Allergies  Current  Outpatient Prescriptions on File Prior to Visit  Medication Sig Dispense Refill  . benzoyl peroxide-erythromycin (BENZAMYCIN) gel Apply topically 2 (two) times daily.  23.3 g  2  . bifidobacterium infantis (ALIGN) capsule Take 1 capsule by mouth daily.  14 capsule  0  . hyoscyamine (LEVSIN/SL) 0.125 MG SL tablet Place 1 tablet (0.125 mg total) under the tongue every 4 (four) hours as needed for cramping.  30 tablet  0  . Loperamide HCl (IMODIUM A-D PO) Take 1 tablet by mouth as needed.      . sertraline (ZOLOFT) 50 MG tablet Take 1 tablet (50 mg total) by mouth daily.  30 tablet  2   No current facility-administered medications on file prior to visit.    BP 100/70  Pulse 74  Temp(Src) 97.8 F (36.6 C) (Oral)  Resp 16  Ht 5' 10.5" (1.791 m)  SpO2 99%    Objective:   Physical Exam  Constitutional: He appears well-developed and well-nourished. No distress.  Psychiatric: He has a normal mood and affect. His behavior is normal. Judgment and thought content normal.  Assessment & Plan:  15 minutes spent with pt today.  >50% of this time was spent counseling pt on depression.

## 2013-03-04 NOTE — Patient Instructions (Addendum)
Please follow up in 2 months. 

## 2013-03-11 ENCOUNTER — Other Ambulatory Visit: Payer: Self-pay | Admitting: Family

## 2013-03-11 NOTE — Telephone Encounter (Signed)
Refill request for Zoloft Last filled by MD on -12/03/12 #30 x2 Last seen-03/14/13 F/U-05/13/13 Please advise refill?

## 2013-05-13 ENCOUNTER — Encounter: Payer: Self-pay | Admitting: Family

## 2013-05-13 ENCOUNTER — Ambulatory Visit (INDEPENDENT_AMBULATORY_CARE_PROVIDER_SITE_OTHER): Payer: BC Managed Care – PPO | Admitting: Family

## 2013-05-13 VITALS — BP 110/69 | HR 62 | Temp 97.9°F | Resp 16 | Ht 70.5 in | Wt 139.1 lb

## 2013-05-13 DIAGNOSIS — F341 Dysthymic disorder: Secondary | ICD-10-CM

## 2013-05-13 DIAGNOSIS — K589 Irritable bowel syndrome without diarrhea: Secondary | ICD-10-CM

## 2013-05-13 DIAGNOSIS — F329 Major depressive disorder, single episode, unspecified: Secondary | ICD-10-CM

## 2013-05-13 MED ORDER — SERTRALINE HCL 50 MG PO TABS: ORAL_TABLET | ORAL | Status: DC

## 2013-05-13 NOTE — Patient Instructions (Addendum)
Please schedule a follow up appointment in 6 months.   

## 2013-05-13 NOTE — Progress Notes (Signed)
  Subjective:    Patient ID: Manuel Patton, male    DOB: May 06, 1981, 32 y.o.   MRN: [id number]  HPI  Manuel Patton is a 32 yr old male who presents today for follow up of his depression. He continues zoloft. Reports that his depression continues to improve.  Sleeping better.  Feels like each week is a little bit better than before. Recently went to [place] to spend time with family which he enjoyed.   IBS- reports that this is much better controlled.      Review of Systems    see HPI  Past Medical History  Diagnosis Date  . History of chicken pox   . Lactose intolerance     History   Social History  . Marital Status: Single    Spouse Name: N/A    Number of Children: N/A  . Years of Education: N/A   Occupational History  . Not on file.   Social History Main Topics  . Smoking status: Current Some Day Smoker  . Smokeless tobacco: Never Used     Comment: 2 cigars a year  . Alcohol Use: .5 - 1 oz/week    1-2 drink(s) per week  . Drug Use: No  . Sexually Active: Not on file   Other Topics Concern  . Not on file   Social History Narrative   Regular exercise:  Roller blade/skating 2-3 x weekly   Caffeine use:  Occasional tea.   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 Tonsillectomy and adenoidectomy  Mar 14, 2000  . Wisdom tooth extraction      Family History  Problem Relation Age of Onset  . Stroke Maternal Grandmother   . Hypertension Other   . Hyperlipidemia Other     Only knows that htn and hyperlipidemia run  on his dad's side  . Hyperlipidemia Father   . Heart attack Neg Hx   . Diabetes Neg Hx   . Sudden death Neg Hx     No Known Allergies  Current Outpatient Prescriptions on File Prior to Visit  Medication Sig Dispense Refill  . bifidobacterium infantis (ALIGN) capsule Take 1 capsule by mouth daily.  14 capsule  0  .  hyoscyamine (LEVSIN/SL) 0.125 MG SL tablet Place 1 tablet (0.125 mg total) under the tongue every 4 (four) hours as needed for cramping.  30 tablet  0  . Loperamide HCl (IMODIUM A-D PO) Take 1 tablet by mouth as needed.       No current facility-administered medications on file prior to visit.    BP 110/69  Pulse 62  Temp(Src) 97.9 F (36.6 C) (Oral)  Resp 16  Ht 505-16-24" (1.791 m)  Wt 139 lb 1.3 oz (63.086 kg)  BMI 19.67 kg/m2  SpO2 99%    Objective:   Physical Exam  Constitutional: He appears well-developed and well-nourished. No distress.  Psychiatric: He has a normal mood and affect. His behavior is normal. Judgment and thought content normal.          Assessment & Plan:

## 2013-05-13 NOTE — Assessment & Plan Note (Signed)
Improved. Monitor.  

## 2013-05-13 NOTE — Assessment & Plan Note (Signed)
Improving. Continue to monitor on zoloft.

## 2013-11-08 ENCOUNTER — Other Ambulatory Visit: Payer: Self-pay | Admitting: Family

## 2013-11-14 ENCOUNTER — Ambulatory Visit (INDEPENDENT_AMBULATORY_CARE_PROVIDER_SITE_OTHER): Payer: BC Managed Care – PPO | Admitting: Family

## 2013-11-14 ENCOUNTER — Encounter: Payer: Self-pay | Admitting: Family

## 2013-11-14 VITALS — BP 100/60 | HR 70 | Temp 98.0°F | Resp 16 | Ht 70.5 in | Wt 147.0 lb

## 2013-11-14 DIAGNOSIS — F329 Major depressive disorder, single episode, unspecified: Secondary | ICD-10-CM

## 2013-11-14 DIAGNOSIS — F341 Dysthymic disorder: Secondary | ICD-10-CM

## 2013-11-14 MED ORDER — VENLAFAXINE HCL 37.5 MG PO TABS: 37.5000 mg | ORAL_TABLET | Freq: Two times a day (BID) | ORAL | Status: DC

## 2013-11-14 NOTE — Assessment & Plan Note (Addendum)
Deteriorated. Will add trial of effexor in addition to zoloft. Pt is instructed to go to the ED if he develops SI/HI and he verbalizes understanding.

## 2013-11-14 NOTE — Patient Instructions (Signed)
Start Effexor. Continue zoloft. Follow up in 1 month. Call if symptoms worsen, go to ER if you develop thoughts of hurting yourself or others.

## 2013-11-14 NOTE — Progress Notes (Signed)
Pre visit review using our clinic review tool, if applicable. No additional management support is needed unless otherwise documented below in the visit note. 

## 2013-11-14 NOTE — Progress Notes (Signed)
Subjective:    Patient ID: Manuel Patton, male    DOB: 1981-05-15, 33 y.o.   MRN: [id number]  HPI  Manuel Patton is a 33 yr old male who presents today for follow up of his depression.  He reports that since October he noted a sudden decline in his mood. He had been feeling well until that time on zoloft.  The holidays also marks the anniversary of his girlfriend's death.  Denies Suicide ideation.  Review of Systems See HPI  Past Medical History  Diagnosis Date  . History of chicken pox   . Lactose intolerance     History   Social History  . Marital Status: Single    Spouse Name: N/A    Number of Children: N/A  . Years of Education: N/A   Occupational History  . Not on file.   Social History Main Topics  . Smoking status: Former Smoker    Quit date: 05/30/2013  . Smokeless tobacco: Never Used     Comment: 2 cigars a year  . Alcohol Use: .5 - 1 oz/week    1-2 drink(s) per week  . Drug Use: No  . Sexual Activity: Not on file   Other Topics Concern  . Not on file   Social History Narrative   Regular exercise:  Roller blade/skating 2-3 x weekly   Caffeine use:  Occasional tea.   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 Tonsillectomy and adenoidectomy  2001  . Wisdom tooth extraction      Family History  Problem Relation Age of Onset  . Stroke Maternal Grandmother   . Hypertension Other   . Hyperlipidemia Other     Only knows that htn and hyperlipidemia run  on his dad's side  . Hyperlipidemia Father   . Heart attack Neg Hx   . Diabetes Neg Hx   . Sudden death Neg Hx     No Known Allergies  Current Outpatient Prescriptions on File Prior to Visit  Medication Sig Dispense Refill  . bifidobacterium infantis (ALIGN) capsule Take 1 capsule by mouth daily.  14 capsule  0  . hyoscyamine (LEVSIN/SL) 0.125 MG SL tablet Place  1 tablet (0.125 mg total) under the tongue every 4 (four) hours as needed for cramping.  30 tablet  0  . Loperamide HCl (IMODIUM A-D PO) Take 1 tablet by mouth as needed.      . ORACEA 40 MG capsule Take 40 mg by mouth daily.      . sertraline (ZOLOFT) 50 MG tablet TAKE 1 TABLET (50 MG TOTAL) BY MOUTH DAILY.  90 tablet  1   No current facility-administered medications on file prior to visit.    BP 100/60  Pulse 70  Temp(Src) 98 F (36.7 C) (Oral)  Resp 16  Ht 5' 10.5" (1.791 m)  Wt 147 lb (66.679 kg)  BMI 20.79 kg/m2  SpO2 99%       Objective:   Physical Exam  Constitutional: He is oriented to person, place, and time. He appears well-developed and well-nourished. No distress.  HENT:  Head: Normocephalic and atraumatic.  Neurological: He is alert and oriented to person, place, and time.  Psychiatric: He has a normal mood and affect. His behavior is normal. Judgment and thought content normal.          Assessment &  Plan:

## 2013-12-12 ENCOUNTER — Ambulatory Visit (INDEPENDENT_AMBULATORY_CARE_PROVIDER_SITE_OTHER): Payer: BC Managed Care – PPO | Admitting: Family

## 2013-12-12 ENCOUNTER — Encounter: Payer: Self-pay | Admitting: Family

## 2013-12-12 ENCOUNTER — Ambulatory Visit: Payer: BC Managed Care – PPO | Admitting: Family

## 2013-12-12 VITALS — BP 102/70 | HR 67 | Temp 97.6°F | Resp 16 | Ht 70.5 in | Wt 146.0 lb

## 2013-12-12 DIAGNOSIS — F341 Dysthymic disorder: Secondary | ICD-10-CM

## 2013-12-12 DIAGNOSIS — F329 Major depressive disorder, single episode, unspecified: Secondary | ICD-10-CM

## 2013-12-12 MED ORDER — VENLAFAXINE HCL 37.5 MG PO TABS: 37.5000 mg | ORAL_TABLET | Freq: Two times a day (BID) | ORAL | Status: DC

## 2013-12-12 NOTE — Progress Notes (Signed)
Pre visit review using our clinic review tool, if applicable. No additional management support is needed unless otherwise documented below in the visit note. 

## 2013-12-12 NOTE — Progress Notes (Addendum)
Subjective:    Patient ID: Manuel Patton, male    DOB: 1981-07-16, 33 y.o.   MRN: [id number]  HPI  Manuel Patton is a 33 yr old male who presents today for follow up of depression. Last visit he noted a decline in his mood despite zoloft. Effexor was added. He reports tolerating effexor without difficulty or side effects. Reports overall improvement in his mood.  Still struggles with emotions re: his girlfriend's death, but notes that he forces himself to "push through."  Reports that he has some trouble staying asleep.  He has been working hard on healthy diet, adding exercise to help with this.     Review of Systems See HPI  Past Medical History  Diagnosis Date  . History of chicken pox   . Lactose intolerance     History   Social History  . Marital Status: Single    Spouse Name: N/A    Number of Children: N/A  . Years of Education: N/A   Occupational History  . Not on file.   Social History Main Topics  . Smoking status: Former Smoker    Quit date: 05/30/2013  . Smokeless tobacco: Never Used     Comment: 2 cigars a year  . Alcohol Use: .5 - 1 oz/week    1-2 drink(s) per week  . Drug Use: No  . Sexual Activity: Not on file   Other Topics Concern  . Not on file   Social History Narrative   Regular exercise:  Roller blade/skating 2-3 x weekly   Caffeine use:  Occasional tea.   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 Tonsillectomy and adenoidectomy  2001  . Wisdom tooth extraction      Family History  Problem Relation Age of Onset  . Stroke Maternal Grandmother   . Hypertension Other   . Hyperlipidemia Other     Only knows that htn and hyperlipidemia run  on his dad's side  . Hyperlipidemia Father   . Heart attack Neg Hx   . Diabetes Neg Hx   . Sudden death Neg Hx     No Known Allergies  Current Outpatient  Prescriptions on File Prior to Visit  Medication Sig Dispense Refill  . Loperamide HCl (IMODIUM A-D PO) Take 1 tablet by mouth as needed.      . ORACEA 40 MG capsule Take 40 mg by mouth daily.      . sertraline (ZOLOFT) 50 MG tablet TAKE 1 TABLET (50 MG TOTAL) BY MOUTH DAILY.  90 tablet  1  . venlafaxine (EFFEXOR) 37.5 MG tablet Take 1 tablet (37.5 mg total) by mouth 2 (two) times daily.  60 tablet  1  . hyoscyamine (LEVSIN/SL) 0.125 MG SL tablet Place 1 tablet (0.125 mg total) under the tongue every 4 (four) hours as needed for cramping.  30 tablet  0   No current facility-administered medications on file prior to visit.    BP 102/70  Pulse 67  Temp(Src) 97.6 F (36.4 C) (Oral)  Resp 16  Ht 5' 10.5" (1.791 m)  Wt 146 lb (66.225 kg)  BMI 20.65 kg/m2  SpO2 97%       Objective:   Physical Exam  Constitutional: He is oriented to person, place, and time. He appears well-developed and well-nourished. No distress.  Neurological: He is alert and  oriented to person, place, and time.  Psychiatric: He has a normal mood and affect. His behavior is normal. Judgment and thought content normal.          Assessment & Plan:

## 2013-12-12 NOTE — Assessment & Plan Note (Signed)
Improved with effexor. Continue effexor and zoloft.  Discussed possibility of adding melatonin or a tablet of bendaryl at bedtime to help with sleep.  Plan follow up in 6 months, sooner if problems/concerns. 15 minutes spent with pt today.  >50% of this time was spent counseling pt on depression.

## 2013-12-12 NOTE — Patient Instructions (Signed)
Please schedule a follow up appointment in 6 months.   

## 2013-12-16 ENCOUNTER — Ambulatory Visit: Payer: BC Managed Care – PPO | Admitting: Family

## 2014-04-25 ENCOUNTER — Other Ambulatory Visit: Payer: Self-pay | Admitting: Family

## 2014-06-10 ENCOUNTER — Ambulatory Visit (INDEPENDENT_AMBULATORY_CARE_PROVIDER_SITE_OTHER): Payer: BC Managed Care – PPO | Admitting: Family

## 2014-06-10 ENCOUNTER — Encounter: Payer: Self-pay | Admitting: Family

## 2014-06-10 VITALS — BP 90/60 | HR 61 | Temp 97.1°F | Resp 16 | Ht 70.5 in | Wt 152.0 lb

## 2014-06-10 DIAGNOSIS — H6120 Impacted cerumen, unspecified ear: Secondary | ICD-10-CM

## 2014-06-10 DIAGNOSIS — F329 Major depressive disorder, single episode, unspecified: Secondary | ICD-10-CM

## 2014-06-10 DIAGNOSIS — F341 Dysthymic disorder: Secondary | ICD-10-CM

## 2014-06-10 DIAGNOSIS — H6123 Impacted cerumen, bilateral: Secondary | ICD-10-CM

## 2014-06-10 MED ORDER — VENLAFAXINE HCL 37.5 MG PO TABS: 37.5000 mg | ORAL_TABLET | Freq: Two times a day (BID) | ORAL | Status: DC

## 2014-06-10 MED ORDER — SERTRALINE HCL 50 MG PO TABS: ORAL_TABLET | ORAL | Status: DC

## 2014-06-10 NOTE — Patient Instructions (Signed)
Please schedule a follow up appointment in 6 months.   

## 2014-06-10 NOTE — Progress Notes (Signed)
Pre visit review using our clinic review tool, if applicable. No additional management support is needed unless otherwise documented below in the visit note. 

## 2014-06-10 NOTE — Assessment & Plan Note (Signed)
Ceruminosis is noted.  Wax is removed by manual debridement. Instructions for home care to prevent wax buildup are given.  

## 2014-06-10 NOTE — Assessment & Plan Note (Signed)
Stable and improved. We discussed possibility of tapering off of his medications.  He wishes to continue for now.

## 2014-06-10 NOTE — Progress Notes (Signed)
Subjective:    Patient ID: Manuel Patton, male    DOB: 31-Mar-1981, 33 y.o.   MRN: [id number]  HPI  Manuel Patton is a 33 yr old male who presents today for follow up.  1) Depression- he is currently maintained on zoloft and effexor. Reports that his mood is good. He has been travelling this  Summer and working on a better "Merchandiser, retailwork/life balance." Feels like he has "leveled off."  Sleeping better.    2) Hearing problem- Pt reports decreased hearing in both ears x 2 months.   Review of Systems See HPI  Past Medical History  Diagnosis Date  . History of chicken pox   . Lactose intolerance     History   Social History  . Marital Status: Single    Spouse Name: N/A    Number of Children: N/A  . Years of Education: N/A   Occupational History  . Not on file.   Social History Main Topics  . Smoking status: Former Smoker    Quit date: 05/30/2013  . Smokeless tobacco: Never Used     Comment: 2 cigars a year  . Alcohol Use: 0.5 - 1.0 oz/week    1-2 drink(s) per week  . Drug Use: No  . Sexual Activity: Not on file   Other Topics Concern  . Not on file   Social History Narrative   Regular exercise:  Roller blade/skating 2-3 x weekly   Caffeine use:  Occasional tea.   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 Tonsillectomy and adenoidectomy  2001  . Wisdom tooth extraction      Family History  Problem Relation Age of Onset  . Stroke Maternal Grandmother   . Hypertension Other   . Hyperlipidemia Other     Only knows that htn and hyperlipidemia run  on his dad's side  . Hyperlipidemia Father   . Heart attack Neg Hx   . Diabetes Neg Hx   . Sudden death Neg Hx     No Known Allergies  Current Outpatient Prescriptions on File Prior to Visit  Medication Sig Dispense Refill  . hyoscyamine (LEVSIN/SL) 0.125 MG SL tablet Place 1 tablet  (0.125 mg total) under the tongue every 4 (four) hours as needed for cramping.  30 tablet  0  . Loperamide HCl (IMODIUM A-D PO) Take 1 tablet by mouth as needed.      . ORACEA 40 MG capsule Take 40 mg by mouth daily.      . sertraline (ZOLOFT) 50 MG tablet TAKE 1 TABLET BY MOUTH EVERY DAY  90 tablet  1  . venlafaxine (EFFEXOR) 37.5 MG tablet Take 1 tablet (37.5 mg total) by mouth 2 (two) times daily.  180 tablet  1   No current facility-administered medications on file prior to visit.    BP 90/60  Pulse 61  Temp(Src) 97.1 F (36.2 C) (Oral)  Resp 16  Ht 5' 10.5" (1.791 m)  Wt 152 lb 0.6 oz (68.965 kg)  BMI 21.50 kg/m2  SpO2 98%       Objective:   Physical Exam  Constitutional: He appears well-developed and well-nourished.  HENT:  Bilateral cerumen impaction  Psychiatric: He has a normal mood and affect. His behavior is normal. Judgment and thought content normal.          Assessment &  Plan:

## 2014-11-10 ENCOUNTER — Ambulatory Visit: Payer: BC Managed Care – PPO | Admitting: Family

## 2014-11-11 ENCOUNTER — Encounter: Payer: Self-pay | Admitting: Family

## 2014-11-11 ENCOUNTER — Ambulatory Visit (INDEPENDENT_AMBULATORY_CARE_PROVIDER_SITE_OTHER): Payer: BLUE CROSS/BLUE SHIELD | Admitting: Family

## 2014-11-11 VITALS — BP 102/70 | HR 64 | Temp 97.5°F | Resp 16 | Ht 70.5 in | Wt 155.4 lb

## 2014-11-11 DIAGNOSIS — F329 Major depressive disorder, single episode, unspecified: Secondary | ICD-10-CM

## 2014-11-11 DIAGNOSIS — H6123 Impacted cerumen, bilateral: Secondary | ICD-10-CM

## 2014-11-11 DIAGNOSIS — F341 Dysthymic disorder: Secondary | ICD-10-CM

## 2014-11-11 MED ORDER — VENLAFAXINE HCL 37.5 MG PO TABS: 37.5000 mg | ORAL_TABLET | Freq: Two times a day (BID) | ORAL | Status: DC

## 2014-11-11 MED ORDER — SERTRALINE HCL 50 MG PO TABS: ORAL_TABLET | ORAL | Status: DC

## 2014-11-11 NOTE — Patient Instructions (Signed)
Please schedule fasting physical at the front desk. Continue zoloft and effexor.

## 2014-11-11 NOTE — Progress Notes (Signed)
Subjective:    Patient ID: Manuel Patton, male    DOB: 14-Mar-1981, 34 y.o.   MRN: [id number]  HPI  [name] is a 34 yr old male who presents today for follow up.  Depression- Pt is currently maintained on sertraline and effexor. Reports that he is coming off of busy season at work. Reports mood is good. Denies anxiety, denies SE from meds.  Reports that his sleep is fair.  Trouble getting up in the morning. Tries to go to bed earlier.   Perks up as the day goes on.  Does not wish to pursue work up for OSA at this time.  Reports that his hearing remains improved since cerumen removal last visit.    Review of Systems See HPI  Past Medical History  Diagnosis Date  . History of chicken pox   . Lactose intolerance     History   Social History  . Marital Status: Single    Spouse Name: N/A    Number of Children: N/A  . Years of Education: N/A   Occupational History  . Not on file.   Social History Main Topics  . Smoking status: Former Smoker    Quit date: 05/30/2013  . Smokeless tobacco: Never Used     Comment: 2 cigars a year  . Alcohol Use: 0.5 - 1.0 oz/week    1-2 drink(s) per week  . Drug Use: No  . Sexual Activity: Not on file   Other Topics Concern  . Not on file   Social History Narrative   Regular exercise:  Roller blade/skating 2-3 x weekly   Caffeine use:  Occasional tea.   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 Tonsillectomy and adenoidectomy  2001  . Wisdom tooth extraction      Family History  Problem Relation Age of Onset  . Stroke Maternal Grandmother   . Hypertension Other   . Hyperlipidemia Other     Only knows that htn and hyperlipidemia run  on his dad's side  . Hyperlipidemia Father   . Heart attack Neg Hx   . Diabetes Neg Hx   . Sudden death Neg Hx     No Known Allergies  Current  Outpatient Prescriptions on File Prior to Visit  Medication Sig Dispense Refill  . hyoscyamine (LEVSIN/SL) 0.125 MG SL tablet Place 1 tablet (0.125 mg total) under the tongue every 4 (four) hours as needed for cramping. 30 tablet 0  . Loperamide HCl (IMODIUM A-D PO) Take 1 tablet by mouth as needed.    . ORACEA 40 MG capsule Take 40 mg by mouth daily.     No current facility-administered medications on file prior to visit.    BP 102/70 mmHg  Pulse 64  Temp(Src) 97.5 F (36.4 C) (Oral)  Resp 16  Ht 5' 10.5" (1.791 m)  Wt 155 lb 6.4 oz (70.489 kg)  BMI 21.98 kg/m2  SpO2 99%       Objective:   Physical Exam  Constitutional: He is oriented to person, place, and time. He appears well-developed and well-nourished. No distress.  HENT:  Head: Normocephalic and atraumatic.  Right Ear: Tympanic membrane and ear canal normal.  Left Ear: Tympanic membrane normal.  Mouth/Throat: No oropharyngeal exudate, posterior oropharyngeal edema or posterior oropharyngeal erythema.  Cardiovascular: Normal rate and regular rhythm.  No murmur heard. Pulmonary/Chest: Effort normal and breath sounds normal.  Neurological: He is alert and oriented to person, place, and time.  Psychiatric: He has a normal mood and affect. His behavior is normal. Judgment and thought content normal.          Assessment & Plan:

## 2014-11-11 NOTE — Progress Notes (Signed)
Pre visit review using our clinic review tool, if applicable. No additional management support is needed unless otherwise documented below in the visit note. 

## 2014-11-11 NOTE — Assessment & Plan Note (Signed)
Stable on current meds.  Continue same. 

## 2014-11-11 NOTE — Assessment & Plan Note (Signed)
Resolved

## 2014-11-30 ENCOUNTER — Ambulatory Visit (INDEPENDENT_AMBULATORY_CARE_PROVIDER_SITE_OTHER): Payer: BLUE CROSS/BLUE SHIELD | Admitting: Sports Medicine

## 2014-11-30 ENCOUNTER — Ambulatory Visit (INDEPENDENT_AMBULATORY_CARE_PROVIDER_SITE_OTHER): Payer: BLUE CROSS/BLUE SHIELD

## 2014-11-30 ENCOUNTER — Encounter: Payer: Self-pay | Admitting: Sports Medicine

## 2014-11-30 VITALS — BP 107/63 | HR 78 | Ht 71.0 in | Wt 158.0 lb

## 2014-11-30 DIAGNOSIS — M25512 Pain in left shoulder: Secondary | ICD-10-CM

## 2014-11-30 MED ORDER — MELOXICAM 15 MG PO TABS: ORAL_TABLET | ORAL | Status: DC

## 2014-11-30 NOTE — Assessment & Plan Note (Signed)
Symptoms are suspicious for a labral tear versus internal impingement of the infraspinatus. At this point we are going to proceed with more formal therapy, x-rays, meloxicam. If no better in one month we will get an MR arthrogram.

## 2014-11-30 NOTE — Progress Notes (Signed)
   Subjective:    I'm seeing this patient as a consultation for:  Sandford CrazeMelissa O'Sullivan, NP  CC: Left shoulder pain  HPI: This is a pleasant 34 year old male, for sometime now she's had pain that he localizes in the anterior aspect of his left shoulder with radiation through the shoulder to the posterior joint line, moderate, persistent, worse with reaching type motions, he gets an occasional pop and click. 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 He has never had advanced imaging or injection.  Past medical history, Surgical history, Family history not pertinant except as noted below, Social history, Allergies, and medications have been entered into the medical record, reviewed, and no changes needed.   Review of Systems: No headache, visual changes, nausea, vomiting, diarrhea, constipation, dizziness, abdominal pain, skin rash, fevers, chills, night sweats, weight loss, swollen lymph nodes, body aches, joint swelling, muscle aches, chest pain, shortness of breath, mood changes, visual or auditory hallucinations.   Objective:   General: Well Developed, well nourished, and in no acute distress.  Neuro/Psych: Alert and oriented x3, extra-ocular muscles intact, able to move all 4 extremities, sensation grossly intact. Skin: Warm and dry, no rashes noted.  Respiratory: Not using accessory muscles, speaking in full sentences, trachea midline.  Cardiovascular: Pulses palpable, no extremity edema. Abdomen: Does not appear distended. Left Shoulder: Inspection reveals no abnormalities, atrophy or asymmetry. Palpation is normal with no tenderness over AC joint or bicipital groove. Tender to palpation of the glenohumeral joint anteriorly ROM is full in all planes. Rotator cuff strength normal throughout. No signs of impingement with negative Neer and Hawkin's tests, empty can. Positive speeds test but a  negative Yergason test. Positive crank test, positive clunk test, negative O'Brien's test. He does have some mild anterior translational instability. Normal scapular function observed. No painful arc and no drop arm sign. No apprehension sign  X-rays unremarkable.  Impression and Recommendations:   This case required medical decision making of moderate complexity.

## 2014-12-09 ENCOUNTER — Ambulatory Visit: Payer: BLUE CROSS/BLUE SHIELD | Attending: Sports Medicine | Admitting: Physical Therapy

## 2014-12-09 DIAGNOSIS — M25512 Pain in left shoulder: Secondary | ICD-10-CM | POA: Insufficient documentation

## 2014-12-16 ENCOUNTER — Encounter: Payer: Self-pay | Admitting: Physical Therapy

## 2014-12-16 ENCOUNTER — Ambulatory Visit: Payer: BLUE CROSS/BLUE SHIELD | Admitting: Physical Therapy

## 2014-12-16 DIAGNOSIS — M25512 Pain in left shoulder: Secondary | ICD-10-CM

## 2014-12-16 NOTE — Therapy (Signed)
[facility] [place] [address]  [address] [place], [place], [address] Phone: [phone]   Fax:  [phone]  Physical Therapy Treatment  Patient Details  Name: Manuel Patton MRN: [id number] Date of Birth: Jun 04, 1981 Referring Provider:  Monica Bectonhekkekandam, Thomas J,*  Encounter Date: 12/16/2014      PT End of Session - 12/16/14 0806    Visit Number 2   Number of Visits 8   Date for PT Re-Evaluation 12/30/14   PT Start Time 0805   PT Stop Time 0855   PT Time Calculation (min) 50 min      Past Medical History  Diagnosis Date  . History of chicken pox   . Lactose intolerance     Past Surgical History  Procedure Laterality Date  . Tonsillectomy and adenoidectomy  2001  . Wisdom tooth extraction      There were no vitals taken for this visit.  Visit Diagnosis:  Left shoulder pain      Subjective Assessment - 12/16/14 0809    Symptoms States tape felt good initially, noted some skin irritation upon removal.  Notes intermittent ache/pain to L shoulder.  States pain has been 4/10 on average when present but has been pain-free some since last treatment.  Currently pain-free.  Is performing HEP.   Currently in Pain? No/denies                    [facility] - 12/16/14 0001    Exercises   Exercises Shoulder   Shoulder Exercises: Supine   Protraction Strengthening;Left;20 reps  on Foam Roll, 3" holds   Protraction Weight (lbs) 8#   Other Supine Exercises CW and CCW with 4# db 20x each   Shoulder Exercises: Seated   Row Both;15 reps;Weights  35# x 15, 45# 15x   Shoulder Exercises: Sidelying   External Rotation Strengthening;Left;15 reps   External Rotation Weight (lbs) 4#   Shoulder Exercises: ROM/Strengthening   UBE (Upper Arm Bike) LVL 2.0, 90"/90"   Plank 2 reps  20"   Shoulder Exercises: Isometric Strengthening   Flexion Supine;Theraband  Diagonal 10x   Theraband Level  (Flexion) Level 3 (Green)   Extension Supine;Theraband;Other (comment)  Diagonal 10x   Theraband Level (Extension) Level 3 (Green)   ABduction Supine;Theraband;Other (comment)  Horiz Abd 10x   Theraband Level (ABduction) Level 3 (Green)   Shoulder Exercises: Body Blade   Flexion 30 seconds;2 reps  Two hands at ~ 20 Flex   ABduction 30 seconds;2 reps  Left; Elbow at side   External Rotation 30 seconds;2 reps  Left, elbow at side   Manual Therapy   Manual Therapy Other (comment);Joint mobilization   Joint Mobilization L Shoulder Grade 2-3 AP mobes 3 x 20"   Other Manual Therapy Kinesiotape to L Shoulder (4 strips)                  PT Short Term Goals - 12/16/14 0908    PT SHORT TERM GOAL #1   Title pt independent with initial HEP by 12/16/14   Status Achieved           PT Long Term Goals - 12/16/14 0909    PT LONG TERM GOAL #1   Title pt demonstrated / verbalizes strong understanding of posture and body mechanics to decrease likelihood of reinjury by 01/07/15   Status On-going   PT LONG TERM GOAL #2   Title pt independent with advanced HEP to  include return to gym based exercise by 01/07/15   Status On-going   PT LONG TERM GOAL #3   Title L Shoulder AROM and MMT equal to R by 01/07/15   Status On-going   PT LONG TERM GOAL #4   Title pt states pain is infrequent in nature (less than 20% of time) and does not interfer with ADLs, work, or driving by 1/61/09   Status On-going               Plan - 12/16/14 0905    Clinical Impression Statement pt tolerating current POC well.  Able to progress exercises today without increased pain.  Pt reports had mild reaction (reddness) following 3 days of wearing tape after initial eval.  He wanted tape again today due to benefit with taping so tape was applied and pt was advised to remove within 24 hours and assess skin.   Pt will benefit from skilled therapeutic intervention in order to improve on the following deficits  Pain;Impaired UE functional use;Decreased strength   Rehab Potential Good        Problem List Patient Active Problem List   Diagnosis Date Noted  . Cerumen impaction 06/10/2014  . Acne 12/03/2012  . Left shoulder pain 11/05/2012  . Neck stiffness 11/05/2012  . Routine general medical examination at a health care facility 07/30/2012  . IBS (irritable bowel syndrome) 07/17/2012  . Depression, reactive 07/17/2012  . Hyperlipidemia 07/17/2012    [name]  PT, OCS  12/16/2014, 9:15 AM  [facility] [address]  [address] [place], [place], [address] Phone: [phone]   Fax:  [phone]

## 2014-12-23 ENCOUNTER — Ambulatory Visit: Payer: BLUE CROSS/BLUE SHIELD | Admitting: Physical Therapy

## 2014-12-23 ENCOUNTER — Encounter: Payer: Self-pay | Admitting: Physical Therapy

## 2014-12-23 DIAGNOSIS — M25512 Pain in left shoulder: Secondary | ICD-10-CM | POA: Diagnosis not present

## 2014-12-23 NOTE — Therapy (Signed)
[facility] [place] [address]  [address] [place], [place], [address] Phone: [phone]   Fax:  [phone]  Physical Therapy Treatment  Patient Details  Name: Manuel Patton MRN: [id number] Date of Birth: 1981-06-20 Referring Provider:  Monica Bectonhekkekandam, Thomas J,*  Encounter Date: 12/23/2014      PT End of Session - 12/23/14 0808    Visit Number 3   Number of Visits 8   Date for PT Re-Evaluation 12/30/14   PT Start Time 0800   PT Stop Time 0853   PT Time Calculation (min) 53 min      Past Medical History  Diagnosis Date  . History of chicken pox   . Lactose intolerance     Past Surgical History  Procedure Laterality Date  . Tonsillectomy and adenoidectomy  2001  . Wisdom tooth extraction      There were no vitals taken for this visit.  Visit Diagnosis:  Left shoulder pain      Subjective Assessment - 12/23/14 0803    Symptoms States wore tape 48 hours and reports skin only slightly irritated with this.  L shoulder pain 4-5/10 on average with activity.  Continues to note pain with leaning onto L UE with driving.  States sleeping is getting better but only because he is not sleeping on L UE as much.  States was able to go to gym and do some modified exercises with light wts without increased pain.   Currently in Pain? Yes   Pain Score 6    Pain Location Shoulder   Pain Orientation Left   Multiple Pain Sites No        TODAY'S TREATMENT: TherEx - UBE lvl 2.5 2'/2' Supine and R Side-lying CW/CCW w/ 6# db 20x each Hooklying on Foam Roll: B Serratus Press 6# 20x, B ER Blue TB 20x, B Horiz ABD Blue TB 20x Hands and Elbows on Wall Green TB at wrists walk up/down 5x BodyBlade B Flexion with 0-100 movement 6x, L ER with elbow to side 30", L ABD with elbow at side 30" Rebounder Chest Toss 2kg,k 3kg, 5kg 20x each (no pain) Prone over pball (65cm) Bent T 10x3", Y 10x3" Standing L GH ER Red TB 15x  Manual - L GH  grade 2 a/p mobes supine with PROM into ER, IR, ABD, and FLEX to tolerance  HEP Instruct and issued HandOut                       PT Education - 12/23/14 0854    Education provided Yes   Education Details HEP Progression   Person(s) Educated Patient   Methods Explanation;Demonstration;Handout   Comprehension Returned demonstration;Verbalized understanding          PT Short Term Goals - 12/16/14 0908    PT SHORT TERM GOAL #1   Title pt independent with initial HEP by 12/16/14   Status Achieved           PT Long Term Goals - 12/23/14 0856    PT LONG TERM GOAL #1   Title pt demonstrated / verbalizes strong understanding of posture and body mechanics to decrease likelihood of reinjury by 01/07/15   Status Achieved   PT LONG TERM GOAL #2   Title pt independent with advanced HEP to include return to gym based exercise by 01/07/15   Status On-going   PT LONG TERM GOAL #3   Title L Shoulder AROM and MMT equal to  R by 01/07/15   Status On-going   PT LONG TERM GOAL #4   Title pt states pain is infrequent in nature (less than 20% of time) and does not interfer with ADLs, work, or driving by 1/61/09   Status On-going               Plan - 12/23/14 0854    Clinical Impression Statement pt again tolerated treatment very well.  Progressed to stability training in higher degrees of flexion without issue today.  Despite good tolerance while in PT clinic, pt continues to report significant L shoulder pain with day to day activities.   PT Next Visit Plan assess for PR to MD next treatment   Consulted and Agree with Plan of Care Patient        Problem List Patient Active Problem List   Diagnosis Date Noted  . Cerumen impaction 06/10/2014  . Acne 12/03/2012  . Left shoulder pain 11/05/2012  . Neck stiffness 11/05/2012  . Routine general medical examination at a health care facility 07/30/2012  . IBS (irritable bowel syndrome) 07/17/2012  . Depression,  reactive 07/17/2012  . Hyperlipidemia 07/17/2012    Gisselle Galvis PT, OCS 12/23/2014, 8:57 AM  [facility] [address]  [address] [place], [place], [address] Phone: [phone]   Fax:  [phone]

## 2014-12-23 NOTE — Patient Instructions (Signed)
HEP Progression with handout: B ER 2x20 2x/day B Horiz ABD 2x20 2x/day TB at wrists wall walk 10x 2x/day L ER at 90 ABD 2x20 2x/day

## 2014-12-26 ENCOUNTER — Other Ambulatory Visit: Payer: Self-pay | Admitting: Family

## 2014-12-28 ENCOUNTER — Telehealth: Payer: Self-pay

## 2014-12-28 NOTE — Telephone Encounter (Signed)
Medication Detail      Disp Refills Start End     venlafaxine (EFFEXOR) 37.5 MG tablet 180 tablet 1 11/11/2014     Sig - Route: Take 1 tablet (37.5 mg total) by mouth 2 (two) times daily. - Oral    E-Prescribing Status: Receipt confirmed by pharmacy (11/11/2014 8:48 AM EST)    LAST OV: 01.16.15 ROV: CPE 03.02.16 scheduled Please Advise on refills/SLS

## 2014-12-28 NOTE — Telephone Encounter (Signed)
Medication: Review, verify sig & reconcile(including outside meds): reviewed  Duplicates discarded: n/a DM supply source: n/a  Preferred Pharmacy and which med where: [id number] - [place], [place] - [address] 90 day supply/mail order: n/a Local pharmacy: [id number] - [place], [place] - [address]  Allergies verified: yes  Immunization Status: Prompted for insurance verification: yes Flu vaccine--declined Tdap--DUE, last injection: 10/30/04   A/P:   Changes to FH, PSH or Personal Hx: reviewed. No changes  Care Teams Updated: ED/Hospital/Urgent Care Visits: no  Prompted for: Updated insurance, contact information, forms: yes Remind to bring: DPR information, advance directives: yes  To Discuss with Provider:  Nothing at this time.

## 2014-12-30 ENCOUNTER — Ambulatory Visit (INDEPENDENT_AMBULATORY_CARE_PROVIDER_SITE_OTHER): Payer: BLUE CROSS/BLUE SHIELD | Admitting: Family

## 2014-12-30 ENCOUNTER — Ambulatory Visit: Payer: BLUE CROSS/BLUE SHIELD | Attending: Sports Medicine | Admitting: Physical Therapy

## 2014-12-30 ENCOUNTER — Ambulatory Visit (INDEPENDENT_AMBULATORY_CARE_PROVIDER_SITE_OTHER): Payer: BLUE CROSS/BLUE SHIELD | Admitting: Sports Medicine

## 2014-12-30 ENCOUNTER — Encounter: Payer: Self-pay | Admitting: Sports Medicine

## 2014-12-30 ENCOUNTER — Encounter: Payer: Self-pay | Admitting: Physical Therapy

## 2014-12-30 ENCOUNTER — Encounter: Payer: Self-pay | Admitting: Family

## 2014-12-30 VITALS — BP 100/60 | HR 87 | Temp 98.3°F | Resp 16 | Ht 71.5 in | Wt 152.0 lb

## 2014-12-30 DIAGNOSIS — M25512 Pain in left shoulder: Secondary | ICD-10-CM | POA: Diagnosis not present

## 2014-12-30 DIAGNOSIS — Z23 Encounter for immunization: Secondary | ICD-10-CM

## 2014-12-30 DIAGNOSIS — F32A Depression, unspecified: Secondary | ICD-10-CM

## 2014-12-30 DIAGNOSIS — Z0000 Encounter for general adult medical examination without abnormal findings: Secondary | ICD-10-CM

## 2014-12-30 DIAGNOSIS — F329 Major depressive disorder, single episode, unspecified: Secondary | ICD-10-CM

## 2014-12-30 LAB — CBC WITH DIFFERENTIAL/PLATELET
BASOS ABS: 0 10*3/uL (ref 0.0–0.1)
Basophils Relative: 0.4 % (ref 0.0–3.0)
EOS PCT: 1.6 % (ref 0.0–5.0)
Eosinophils Absolute: 0.1 10*3/uL (ref 0.0–0.7)
HEMATOCRIT: 45 % (ref 39.0–52.0)
Hemoglobin: 15.6 g/dL (ref 13.0–17.0)
LYMPHS ABS: 1.5 10*3/uL (ref 0.7–4.0)
Lymphocytes Relative: 29.3 % (ref 12.0–46.0)
MCHC: 34.6 g/dL (ref 30.0–36.0)
MCV: 89.6 fl (ref 78.0–100.0)
MONO ABS: 0.3 10*3/uL (ref 0.1–1.0)
Monocytes Relative: 5.9 % (ref 3.0–12.0)
Neutro Abs: 3.2 10*3/uL (ref 1.4–7.7)
Neutrophils Relative %: 62.8 % (ref 43.0–77.0)
PLATELETS: 219 10*3/uL (ref 150.0–400.0)
RBC: 5.02 Mil/uL (ref 4.22–5.81)
RDW: 12.5 % (ref 11.5–15.5)
WBC: 5.1 10*3/uL (ref 4.0–10.5)

## 2014-12-30 LAB — URINALYSIS, ROUTINE W REFLEX MICROSCOPIC
BILIRUBIN URINE: NEGATIVE
Hgb urine dipstick: NEGATIVE
KETONES UR: NEGATIVE
LEUKOCYTES UA: NEGATIVE
NITRITE: NEGATIVE
PH: 6 (ref 5.0–8.0)
RBC / HPF: NONE SEEN (ref 0–?)
SPECIFIC GRAVITY, URINE: 1.025 (ref 1.000–1.030)
Total Protein, Urine: NEGATIVE
UROBILINOGEN UA: 0.2 (ref 0.0–1.0)
Urine Glucose: NEGATIVE

## 2014-12-30 LAB — BASIC METABOLIC PANEL
BUN: 15 mg/dL (ref 6–23)
CALCIUM: 9.9 mg/dL (ref 8.4–10.5)
CO2: 30 mEq/L (ref 19–32)
Chloride: 104 mEq/L (ref 96–112)
Creatinine, Ser: 1.09 mg/dL (ref 0.40–1.50)
GFR: 82.84 mL/min (ref >60.00)
Glucose, Bld: 86 mg/dL (ref 70–99)
Potassium: 4 mEq/L (ref 3.5–5.1)
Sodium: 138 mEq/L (ref 135–145)

## 2014-12-30 LAB — HEPATIC FUNCTION PANEL
ALK PHOS: 67 U/L (ref 39–117)
ALT: 32 U/L (ref 0–53)
AST: 27 U/L (ref 0–37)
Albumin: 4.9 g/dL (ref 3.5–5.2)
BILIRUBIN DIRECT: 0.1 mg/dL (ref 0.0–0.3)
BILIRUBIN TOTAL: 0.7 mg/dL (ref 0.2–1.2)
Total Protein: 7.6 g/dL (ref 6.0–8.3)

## 2014-12-30 LAB — LIPID PANEL
CHOL/HDL RATIO: 4
Cholesterol: 170 mg/dL (ref 0–200)
HDL: 45.9 mg/dL (ref >39.00)
LDL CALC: 103 mg/dL — AB (ref 0–99)
NonHDL: 124.1
Triglycerides: 106 mg/dL (ref 0.0–149.0)
VLDL: 21.2 mg/dL (ref 0.0–40.0)

## 2014-12-30 LAB — TSH: TSH: 1.73 u[IU]/mL (ref 0.35–4.50)

## 2014-12-30 MED ORDER — VENLAFAXINE HCL 75 MG PO TABS: 75.0000 mg | ORAL_TABLET | Freq: Two times a day (BID) | ORAL | Status: DC

## 2014-12-30 NOTE — Progress Notes (Signed)
  Subjective:    CC: Follow-up  HPI: Manuel Patton returns, regarding his left shoulder pain, we suspected predominantly a labral tear versus internal impingement of the infraspinatus, he has now been through 6 weeks of formal physical therapy, NSAIDs, x-rays were unremarkable, continues to have pain.  Past medical history, Surgical history, Family history not pertinant except as noted below, Social history, Allergies, and medications have been entered into the medical record, reviewed, and no changes needed.   Review of Systems: No fevers, chills, night sweats, weight loss, chest pain, or shortness of breath.   Objective:    General: Well Developed, well nourished, and in no acute distress.  Neuro: Alert and oriented x3, extra-ocular muscles intact, sensation grossly intact.  HEENT: Normocephalic, atraumatic, pupils equal round reactive to light, neck supple, no masses, no lymphadenopathy, thyroid nonpalpable.  Skin: Warm and dry, no rashes. Cardiac: Regular rate and rhythm, no murmurs rubs or gallops, no lower extremity edema.  Respiratory: Clear to auscultation bilaterally. Not using accessory muscles, speaking in full sentences.  Impression and Recommendations:

## 2014-12-30 NOTE — Progress Notes (Signed)
Subjective:    Patient ID: Manuel Patton, male    DOB: 03-Feb-1981, 34 y.o.   MRN: [id number]  HPI  Patient presents today for complete physical.  Immunizations: due for tetanus.  Diet: healthy, high protein, low sugar/low salt Exercise: no x 1 month due to shoulder injury.   Anxiety/depression- reports that recently he feels more depressed because of inability to exercise due to shoulder pain which is being evaluated by ortho. Notes that he feels like he really has to push himself through the day, unmotivated on the weekends.   L shoulder pain- seeing sports med, physical therapy.  Was told likely labral tear.   Review of Systems  HENT: Negative for rhinorrhea.   Respiratory: Negative for shortness of breath.   Cardiovascular: Negative for chest pain.  Gastrointestinal: Negative for nausea, vomiting, abdominal pain and diarrhea.  Genitourinary: Negative for dysuria and frequency.  Musculoskeletal:       See hpi  Skin: Negative for rash.  Neurological: Negative for dizziness and headaches.  Hematological: Negative for adenopathy.  Psychiatric/Behavioral:       See hpi   Past Medical History  Diagnosis Date  . History of chicken pox   . Lactose intolerance     History   Social History  . Marital Status: Single    Spouse Name: N/A  . Number of Children: N/A  . Years of Education: N/A   Occupational History  . Not on file.   Social History Main Topics  . Smoking status: Former Smoker    Quit date: 05/30/2013  . Smokeless tobacco: Never Used     Comment: 2 cigars a year  . Alcohol Use: No     Comment: No alcohol in 1 year  . Drug Use: No  . Sexual Activity: Not on file   Other Topics Concern  . Not on file   Social History Narrative   Regular exercise:  Roller blade/skating 2-3 x weekly   Caffeine use:  Occasional tea.   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 Tonsillectomy and adenoidectomy  04-Mar-2000  . Wisdom tooth extraction      Family History  Problem Relation Age of Onset  . Stroke Maternal Grandmother   . Hypertension Other   . Hyperlipidemia Other     Only knows that htn and hyperlipidemia run  on his dad's side  . Hyperlipidemia Father   . Heart attack Neg Hx   . Diabetes Neg Hx   . Sudden death Neg Hx     No Known Allergies  Current Outpatient Prescriptions on File Prior to Visit  Medication Sig Dispense Refill  . meloxicam (MOBIC) 15 MG tablet One tab PO qAM with breakfast for 2 weeks, then daily prn pain. 30 tablet 3  . ORACEA 40 MG capsule Take 40 mg by mouth daily.    . sertraline (ZOLOFT) 50 MG tablet TAKE 1 TABLET BY MOUTH EVERY DAY 90 tablet 1   No current facility-administered medications on file prior to visit.    BP 100/60 mmHg  Pulse 87  Temp(Src) 98.3 F (36.8 C) (Oral)  Resp 16  Ht 5' 11.5" (1.816 m)  Wt 152 lb (68.947 kg)  BMI 20.91 kg/m2  SpO2 98%       Objective:   Physical Exam Physical Exam  Constitutional: He is oriented  to person, place, and time. He appears well-developed and well-nourished. No distress.  HENT:  Head: Normocephalic and atraumatic.  Right Ear: Tympanic membrane and ear canal normal.  Left Ear: Tympanic membrane and ear canal normal.  Mouth/Throat: Oropharynx is clear and moist.  Eyes: Pupils are equal, round, and reactive to light. No scleral icterus.  Neck: Normal range of motion. No thyromegaly present.  Cardiovascular: Normal rate and regular rhythm.   No murmur heard. Pulmonary/Chest: Effort normal and breath sounds normal. No respiratory distress. He has no wheezes. He has no rales. He exhibits no tenderness.  Abdominal: Soft. Bowel sounds are normal. He exhibits no distension and no mass. There is no tenderness. There is no rebound and no guarding.  Musculoskeletal: He exhibits no edema.  Lymphadenopathy:    He has no  cervical adenopathy.  Neurological: He is alert and oriented to person, place, and time. He 3+ bilateral reflexes. He exhibits normal muscle tone. Coordination normal.  Skin: Skin is warm and dry.  Psychiatric: He has a normal mood and affect. His behavior is normal. Judgment and thought content normal.          Assessment & Plan:              Assessment & Plan:         Assessment & Plan:

## 2014-12-30 NOTE — Patient Instructions (Signed)
Increase effexor to 75 mg twice daily. Complete lab work prior to leaving. Follow up in 2 months.

## 2014-12-30 NOTE — Assessment & Plan Note (Signed)
Deteriorated. Continue zoloft, increase effexor.  Follow up in 2 months, pt instructed to call if symptoms worsen and verbalizes understanding.

## 2014-12-30 NOTE — Assessment & Plan Note (Signed)
Continue healthy diet, exercise as able.  tdap today.  Obtain routine lab work and hiv screen.

## 2014-12-30 NOTE — Addendum Note (Signed)
Addended by: Mervin KungFERGERSON, Richa Shor A on: 12/30/2014 10:09 AM   Modules accepted: Orders

## 2014-12-30 NOTE — Assessment & Plan Note (Signed)
Symptoms are persistent and suggestive of a glenoid labral tear versus internal impingement of the infraspinatus. He has finished 6 weeks of formal physical therapy, NSAIDs, and unfortunately has not improved. At this point we are going to proceed with an MR arthrogram, the steroid component of the arthrogram will be both diagnostic and therapeutic. Return for arthrogram injection.

## 2014-12-30 NOTE — Therapy (Signed)
[facility] [address]  [place], [place], [address] Phone: [phone]   Fax:  [phone]  Physical Therapy Treatment  Patient Details  Name: Manuel Patton MRN: [id number] Date of Birth: 1981-05-16 Referring Provider:  Silverio Decamp,*  Encounter Date: 12/30/2014      PT End of Session - 12/30/14 0826    Visit Number 4   Number of Visits 8   Date for PT Re-Evaluation 12/30/14   PT Start Time 0804   PT Stop Time 0900   PT Time Calculation (min) 56 min      Past Medical History  Diagnosis Date  . History of chicken pox   . Lactose intolerance     Past Surgical History  Procedure Laterality Date  . Tonsillectomy and adenoidectomy  2001  . Wisdom tooth extraction      There were no vitals taken for this visit.  Visit Diagnosis:  Left shoulder pain      Subjective Assessment - 12/30/14 0806    Symptoms States shoulder seems only slightly improved so far.  He notes difficulty with driving, bathing with L UE, reaching with L UE.  Pain is more often noted with forward reaching and pushing activities not as bad with pulling.  Overall pt states shoulder is "a tiny bit better".   Currently in Pain? Yes   Pain Score --  L Shoulder pain 0/10 at rest, 4/10 on average with activity, and worst 7/10 (quick, stabbing pain)   Pain Location Shoulder  posteriolateral aspect of L shoulder   Aggravating Factors  reaching, leaning on L UE   Pain Relieving Factors rest, some benefit with ice and meds   Multiple Pain Sites No          [facility] PT Assessment - 12/30/14 0001    ROM / Strength   AROM / PROM / Strength AROM;Strength;PROM   AROM   Overall AROM Comments mild biting pain with end range FLEX, ABD, and ER with intermittent clicking sensation   PROM   Overall PROM Comments L Shoulder PROM is WNL noting mild pain with end range Flexion, ER, and ABD   Strength   Overall Strength Comments L Shoulder  5/5 grossly noting some mild ache pain following testing   Special Tests    Special Tests Rotator Cuff Impingement;Laxity/Instability Tests;Biceps/Labral Tests   Rotator Cuff Impingment tests Neer impingement test;Hawkins- Kennedy test;Belly Press;Empty Can test;Full Can test;Speed's test   Laxity/Instability  other;other2   Biceps/Labral tests Crank Test   Neer Impingement test    Findings Positive   Hawkins-Kennedy test   Findings Positive   Belly Press   Findings Negative   Empty Can test   Findings Negative   Full Can test   Findings Negative   Speed's test   Findings Negative   Other   Findings Positive   comment JERK TESTING, painful without instability   other   Findings Positive   Comment PUSH/PULL TESTING - painful and some posterior GH instability   O'Brien's Test   Findings Negative   Crank Test   Findings Positive         TODAY'S TREATMENT: Re-Eval for PR to MD  TherEx - UBE lvl 2.5 90"/90"' 4 strips kinesiology taping L shoulder BodyBlade B Flexion with 0-100 movement 8x, L ER with movement 0-90 ABD and 0-90 ER 4x, L ABD with 0-90 ABD 6x. Rebounder Chest Toss/Catch 3kg and 5kg 20x each (no pain); OH  Toss/Catch 2kg 20x (no pain) Plank elbows and toes 30" TRX standing Bent T 10x TRX Push-up 10x                     PT Short Term Goals - 12/16/14 0908    PT SHORT TERM GOAL #1   Title pt independent with initial HEP by 12/16/14   Status Achieved           PT Long Term Goals - 12/30/14 0827    PT LONG TERM GOAL #1   Title pt demonstrated / verbalizes strong understanding of posture and body mechanics to decrease likelihood of reinjury by 01/07/15   Status Achieved   PT LONG TERM GOAL #2   Title pt independent with advanced HEP to include return to gym based exercise by 01/07/15   Status On-going   PT LONG TERM GOAL #3   Title L Shoulder AROM and MMT equal to R by 01/07/15   Status Partially Met   PT LONG TERM GOAL #4   Title pt  states pain is infrequent in nature (less than 20% of time) and does not interfer with ADLs, work, or driving by 7/93/90   Status On-going               Plan - 12/30/14 0902    Clinical Impression Statement pt tolerates treatments focusing on shoulder stability throughout increasing ROM very well but reports minimal improvement in pain to date.  s/s indicate mild posterior capsule strain vs possible labral tear.   Pt will benefit from skilled therapeutic intervention in order to improve on the following deficits Pain;Impaired UE functional use   Rehab Potential Good   PT Frequency --  1-2x/wk for additional 4 weeks   PT Treatment/Interventions Therapeutic exercise;Electrical Stimulation;Cryotherapy;Dry needling;Manual techniques;Ultrasound;Moist Heat;Therapeutic activities;Patient/family education;Other (comment)  iontophoresis with 78m/min Dex   PT Next Visit Plan progress as tolerated, possible addition of 20% [place]/or iontophoresis   Consulted and Agree with Plan of Care Patient        Problem List Patient Active Problem List   Diagnosis Date Noted  . Cerumen impaction 06/10/2014  . Acne 12/03/2012  . Left shoulder pain 11/05/2012  . Neck stiffness 11/05/2012  . Routine general medical examination at a health care facility 07/30/2012  . IBS (irritable bowel syndrome) 07/17/2012  . Depression, reactive 07/17/2012  . Hyperlipidemia 07/17/2012    [name] PT, OCS 12/30/2014, 9:09 AM  [facility] [address]Phone: [phone]  Fax:  [phone]

## 2014-12-30 NOTE — Assessment & Plan Note (Signed)
Following with PT and sports medicine.

## 2014-12-31 ENCOUNTER — Ambulatory Visit: Payer: BLUE CROSS/BLUE SHIELD | Admitting: Sports Medicine

## 2014-12-31 LAB — HIV ANTIBODY (ROUTINE TESTING W REFLEX): HIV 1&2 Ab, 4th Generation: NONREACTIVE

## 2015-01-01 ENCOUNTER — Encounter: Payer: Self-pay | Admitting: Family

## 2015-01-04 ENCOUNTER — Ambulatory Visit (INDEPENDENT_AMBULATORY_CARE_PROVIDER_SITE_OTHER): Payer: BLUE CROSS/BLUE SHIELD

## 2015-01-04 ENCOUNTER — Ambulatory Visit (INDEPENDENT_AMBULATORY_CARE_PROVIDER_SITE_OTHER): Payer: BLUE CROSS/BLUE SHIELD | Admitting: Sports Medicine

## 2015-01-04 ENCOUNTER — Encounter: Payer: Self-pay | Admitting: Sports Medicine

## 2015-01-04 VITALS — BP 109/68 | HR 82 | Wt 149.0 lb

## 2015-01-04 DIAGNOSIS — M25512 Pain in left shoulder: Secondary | ICD-10-CM

## 2015-01-04 DIAGNOSIS — M75102 Unspecified rotator cuff tear or rupture of left shoulder, not specified as traumatic: Secondary | ICD-10-CM

## 2015-01-04 DIAGNOSIS — R0602 Shortness of breath: Secondary | ICD-10-CM

## 2015-01-04 NOTE — Progress Notes (Signed)
  Procedure: Real-time Ultrasound Guided gadolinium contrast injection of left glenohumeral joint Device: GE Logiq E  Verbal informed consent obtained.  Time-out conducted.  Noted no overlying erythema, induration, or other signs of local infection.  Skin prepped in a sterile fashion.  Local anesthesia: Topical Ethyl chloride.  With sterile technique and under real time ultrasound guidance:  1 mL kenalog 40, 4 mL lidocaine injected easily, syringe switched and 0.1 mL dilute gadolinium injected, syringe again switched and 10 mL of sterile saline injected. The patient did have a mild vagal response which resolved quickly. Joint visualized and capsule seen distending confirming intra-articular placement of contrast material and medication. Completed without difficulty  Advised to call if fevers/chills, erythema, induration, drainage, or persistent bleeding.  Images permanently stored and available for review in the ultrasound unit.  Impression: Technically successful ultrasound guided gadolinium contrast injection for MR arthrography.  Please see separate MR arthrogram report.

## 2015-01-04 NOTE — Assessment & Plan Note (Signed)
Arthrogram injection as above. Further management will depend on results of the MRI.

## 2015-01-05 ENCOUNTER — Encounter: Payer: Self-pay | Admitting: Physical Therapy

## 2015-01-05 ENCOUNTER — Ambulatory Visit: Payer: BLUE CROSS/BLUE SHIELD | Admitting: Physical Therapy

## 2015-01-05 DIAGNOSIS — M25512 Pain in left shoulder: Secondary | ICD-10-CM

## 2015-01-05 NOTE — Patient Instructions (Signed)
Instructed in gentle self mobes to L GH (caudal and a/p vs p/a) to perform PRN to assist with pain. Also showed pt options for shoulder support such as EVS shoulder brace.

## 2015-01-05 NOTE — Therapy (Signed)
[facility] [address]  [place], [place], [address] Phone: [phone]   Fax:  [phone]  Physical Therapy Treatment  Patient Details  Name: Manuel Patton MRN: [id number] Date of Birth: 05-Jul-1981 Referring Provider:  Silverio Decamp,*  Encounter Date: 01/05/2015      PT End of Session - 01/05/15 0718    Visit Number 5   Number of Visits 12   Date for PT Re-Evaluation 02/02/15   PT Start Time 0715   PT Stop Time 3875   PT Time Calculation (min) 40 min      Past Medical History  Diagnosis Date  . History of chicken pox   . Lactose intolerance     Past Surgical History  Procedure Laterality Date  . Tonsillectomy and adenoidectomy  2001  . Wisdom tooth extraction      There were no vitals taken for this visit.  Visit Diagnosis:  Left shoulder pain      Subjective Assessment - 01/05/15 0721    Symptoms pt underwent MRI with contrast yesterday.  MRI findings include posterior labral tear along with mild degenation of anterior and superior labrum and RC.  Pt currently not interested in surgery at this time.  He has f/u with MD in one month at which time may consider surgery if not improving.  He c/o increased pain since MRI yesterday due to positioning required during testing.   Currently in Pain? Yes   Pain Score 5    Pain Location Shoulder   Pain Orientation Left   Multiple Pain Sites No        TODAY'S TREATMENT: THEREX - UBE LVL 2.5 90"/90", Supine R shoulder CW/CCW with 6# db then same in R side-lying. R Side-lying L ER 3# 20x, R Side-lying L ABD 10-30 degrees with 3# 20x.  MANUAL - supine L GH gentle distraction and grade 2 AP and caudal mobes to decrease pain.  Pt instructed in same in seated position to assist with pain control.  IONTO - 4 hour patch with 66m/min dex to L posteriolateral shoulder.  [facility] if [facility] any  discomfort.                  PT Education - 01/05/15 0757    Education provided Yes   Education Details pain control options   Person(s) Educated Patient   Methods Explanation;Demonstration   Comprehension Verbalized understanding;Returned demonstration          PT Short Term Goals - 12/16/14 0908    PT SHORT TERM GOAL #1   Title pt independent with initial HEP by 12/16/14   Status Achieved           PT Long Term Goals - 12/30/14 0827    PT LONG TERM GOAL #1   Title pt demonstrated / verbalizes strong understanding of posture and body mechanics to decrease likelihood of reinjury by 01/07/15   Status Achieved   PT LONG TERM GOAL #2   Title pt independent with advanced HEP to include return to gym based exercise by 01/07/15   Status On-going   PT LONG TERM GOAL #3   Title L Shoulder AROM and MMT equal to R by 01/07/15   Status Partially Met   PT LONG TERM GOAL #4   Title pt states pain is infrequent in nature (less than 20% of time) and does not interfer with ADLs, work, or driving by  01/07/15   Status On-going               Plan - 01/05/15 0739    Clinical Impression Statement pt with confirmed labral tear, but not so large as to require mandatory surgery. He is going to attempt further imrpovement with one additional month of PT then decide on surgery or not.   Pt will benefit from skilled therapeutic intervention in order to improve on the following deficits Pain;Impaired UE functional use   Rehab Potential Good   PT Frequency 2x / week   PT Duration 4 weeks   PT Treatment/Interventions Therapeutic exercise;Electrical Stimulation;Cryotherapy;Dry needling;Manual techniques;Ultrasound;Moist Heat;Therapeutic activities;Patient/family education;Other (comment)   Consulted and Agree with Plan of Care Patient        Problem List Patient Active Problem List   Diagnosis Date Noted  . Acne 12/03/2012  . Left shoulder pain 11/05/2012  . Neck stiffness  11/05/2012  . Routine general medical examination at a health care facility 07/30/2012  . IBS (irritable bowel syndrome) 07/17/2012  . Depression 07/17/2012  . Hyperlipidemia 07/17/2012    HALL,RALPH PT, OCS 01/05/2015, 7:59 AM  [facility] [address]  [place], [place], [address] Phone: [phone]   Fax:  [phone]

## 2015-01-06 ENCOUNTER — Ambulatory Visit: Payer: BLUE CROSS/BLUE SHIELD | Admitting: Physical Therapy

## 2015-01-08 ENCOUNTER — Encounter: Payer: Self-pay | Admitting: Physical Therapy

## 2015-01-08 ENCOUNTER — Ambulatory Visit: Payer: BLUE CROSS/BLUE SHIELD | Admitting: Physical Therapy

## 2015-01-08 DIAGNOSIS — M25512 Pain in left shoulder: Secondary | ICD-10-CM

## 2015-01-08 NOTE — Therapy (Signed)
[facility] [address]  [place], [place], [address] Phone: [phone]   Fax:  [phone]  Physical Therapy Treatment  Patient Details  Name: Manuel Patton MRN: [id number] Date of Birth: 1981/10/07 Referring Provider:  Silverio Decamp,*  Encounter Date: 01/08/2015      PT End of Session - 01/08/15 0748    Visit Number 6   Number of Visits 12   Date for PT Re-Evaluation 02/02/15   PT Start Time 0715   PT Stop Time 0745   PT Time Calculation (min) 30 min      Past Medical History  Diagnosis Date  . History of chicken pox   . Lactose intolerance     Past Surgical History  Procedure Laterality Date  . Tonsillectomy and adenoidectomy  2001  . Wisdom tooth extraction      There were no vitals filed for this visit.  Visit Diagnosis:  Left shoulder pain      Subjective Assessment - 01/08/15 0718    Symptoms states felt best he has to date following last treatment which consisted primarily of manual therapy and ionto patch.   Currently in Pain? Yes   Pain Score 2    Pain Location Shoulder   Pain Orientation Left   Multiple Pain Sites No             TODAY'S TREATMENT: MANUAL - supine L GH gentle distraction and grade 2 AP and caudal mobes to decrease pain.  THEREX - UBE LVL 3.0 90"/90", Supine R shoulder CW/CCW with 6# db then same in R side-lying. R Side-lying L ER 4# 2x20 Low Row 45# 2x15 Body Blade ABD/ADD at 30ish degrees 2x30"   IONTO - 4 hour patch with 51m/min dex to L posteriolateral shoulder. Pt [facility] if notes any discomfort.                    PT Short Term Goals - 12/16/14 0908    PT SHORT TERM GOAL #1   Title pt independent with initial HEP by 12/16/14   Status Achieved           PT Long Term Goals - 12/30/14 0827    PT LONG TERM GOAL #1   Title pt demonstrated / verbalizes strong understanding of posture and  body mechanics to decrease likelihood of reinjury by 01/07/15   Status Achieved   PT LONG TERM GOAL #2   Title pt independent with advanced HEP to include return to gym based exercise by 01/07/15   Status On-going   PT LONG TERM GOAL #3   Title L Shoulder AROM and MMT equal to R by 01/07/15   Status Partially Met   PT LONG TERM GOAL #4   Title pt states pain is infrequent in nature (less than 20% of time) and does not interfer with ADLs, work, or driving by 35/63/87  Status On-going               Plan - 01/08/15 0748    Clinical Impression Statement pt tolerated gentle progression to therex today without c/o increased pain.  Reports benefit from last treatment so replicated today.        Problem List Patient Active Problem List   Diagnosis Date Noted  . Acne 12/03/2012  . Left shoulder pain 11/05/2012  . Neck stiffness 11/05/2012  . Routine general medical examination at a health care facility  07/30/2012  . IBS (irritable bowel syndrome) 07/17/2012  . Depression 07/17/2012  . Hyperlipidemia 07/17/2012    Vianny Schraeder PT, OCS 01/08/2015, 7:50 AM  [facility] [address]  [place], [place], [address] Phone: [phone]   Fax:  [phone]

## 2015-01-12 ENCOUNTER — Ambulatory Visit: Payer: BLUE CROSS/BLUE SHIELD | Admitting: Physical Therapy

## 2015-01-12 DIAGNOSIS — M25512 Pain in left shoulder: Secondary | ICD-10-CM

## 2015-01-12 NOTE — Therapy (Signed)
[facility] [address]  [place], [place], [address] Phone: [phone]   Fax:  [phone]  Physical Therapy Treatment  Patient Details  Name: Manuel Patton MRN: [id number] Date of Birth: 07/09/1981 Referring Provider:  Silverio Decamp,*  Encounter Date: 01/12/2015      PT End of Session - 01/12/15 0720    Visit Number 7   Number of Visits 12   Date for PT Re-Evaluation 02/02/15   PT Start Time 0716   PT Stop Time 0806   PT Time Calculation (min) 50 min      Past Medical History  Diagnosis Date  . History of chicken pox   . Lactose intolerance     Past Surgical History  Procedure Laterality Date  . Tonsillectomy and adenoidectomy  2001  . Wisdom tooth extraction      There were no vitals filed for this visit.  Visit Diagnosis:  Left shoulder pain      Subjective Assessment - 01/12/15 0718    Symptoms no significant increase in pain since last treatment stating pain ranges 0-3/10.  Was able to go to gym without increase pain but did not perform any upper body resistance training other than HEP.  Otherwise performed cardio training.   Currently in Pain? Yes   Pain Score 3   with activity   Pain Location Shoulder   Pain Orientation Left           TODAY'S TREATMENT:  THEREX - UBE LVL 3.0 90"/90" Supine R shoulder CW/CCW with 6# db then same in R side-lying. [place] Getup with 6# db in R UE 4x with R UE stabilizing in Flexion then 4x with 6# in L UE as R UE is stabilizing against ground in Extension TRX Standing Bent T (kept distance easy, no pain or clicking) 09B TRX FW Lean with Flex/Ext and ABD/ADD small movements for multidirectional stability 2x30" TRX Push-up 10x (distance kept easy, no pain or clicking)  [place] - 35%, 3.2D/JM4, 5cm, 8', 1MHz, posterior L Shoulder  IONTO #3 - 4 hour patch with 39m/min dex to L posteriolateral shoulder. Pt advised to remove at 12PM or  earlier if notes any discomfort.                      PT Short Term Goals - 12/16/14 0908    PT SHORT TERM GOAL #1   Title pt independent with initial HEP by 12/16/14   Status Achieved           PT Long Term Goals - 12/30/14 0827    PT LONG TERM GOAL #1   Title pt demonstrated / verbalizes strong understanding of posture and body mechanics to decrease likelihood of reinjury by 01/07/15   Status Achieved   PT LONG TERM GOAL #2   Title pt independent with advanced HEP to include return to gym based exercise by 01/07/15   Status On-going   PT LONG TERM GOAL #3   Title L Shoulder AROM and MMT equal to R by 01/07/15   Status Partially Met   PT LONG TERM GOAL #4   Title pt states pain is infrequent in nature (less than 20% of time) and does not interfer with ADLs, work, or driving by 32/68/34  Status On-going               Plan - 01/12/15 0812    Clinical Impression Statement pt without c/o pain  with stability progressions today, known posterior labral tear per MRI along with RC injury.  Seems may be able to avoid surgery if progressions kept slow.   Pt will benefit from skilled therapeutic intervention in order to improve on the following deficits Pain;Impaired UE functional use   Rehab Potential Good   PT Treatment/Interventions Therapeutic exercise;Electrical Stimulation;Cryotherapy;Dry needling;Manual techniques;Ultrasound;Moist Heat;Therapeutic activities;Patient/family education;Other (comment)   PT Next Visit Plan progress as tolerated   Consulted and Agree with Plan of Care Patient        Problem List Patient Active Problem List   Diagnosis Date Noted  . Acne 12/03/2012  . Left shoulder pain 11/05/2012  . Neck stiffness 11/05/2012  . Routine general medical examination at a health care facility 07/30/2012  . IBS (irritable bowel syndrome) 07/17/2012  . Depression 07/17/2012  . Hyperlipidemia 07/17/2012    Lachelle Rissler PT, OCS 01/12/2015, 8:15  AM  [facility] [address]  [place], [place], [address] Phone: [phone]   Fax:  [phone]

## 2015-01-15 ENCOUNTER — Ambulatory Visit: Payer: BLUE CROSS/BLUE SHIELD | Admitting: Physical Therapy

## 2015-01-15 DIAGNOSIS — M25512 Pain in left shoulder: Secondary | ICD-10-CM

## 2015-01-15 NOTE — Therapy (Signed)
[facility] [address]  [place], [place], [address] Phone: [phone]   Fax:  [phone]  Physical Therapy Treatment  Patient Details  Name: Manuel Patton MRN: [id number] Date of Birth: [birth year] Referring Provider:  Silverio Decamp,*  Encounter Date: 01/15/2015      PT End of Session - 01/15/15 0730    Visit Number 8   Number of Visits 12   Date for PT Re-Evaluation 02/02/15   PT Start Time 0719   PT Stop Time 0800   PT Time Calculation (min) 41 min      Past Medical History  Diagnosis Date  . History of chicken pox   . Lactose intolerance     Past Surgical History  Procedure Laterality Date  . Tonsillectomy and adenoidectomy  2001  . Wisdom tooth extraction      There were no vitals filed for this visit.  Visit Diagnosis:  Left shoulder pain      Subjective Assessment - 01/15/15 0731    Symptoms no increased pain following last treatment.   Currently in Pain? Yes   Pain Score --  less frequent pain, is 3/10 on AVG when present   Pain Location Shoulder   Pain Orientation Left   Multiple Pain Sites No           TODAY'S TREATMENT:  THEREX - UBE LVL 3.0 90"/90" TRX Low Row 2x15 TRX Push-up 2x15 (distance kept easy, no pain or clicking) TRX High Row x15 Elbow wall walk with Blue TB at wrists 5x Hand lateral walkout on wall with Blue TB at wrists 5x Knees on plinth, hands on Fitter, 1 Black + 1 Blue R/L 2x30", F/B 2x30"   [place] - 20%, 0.5W/cm2, 5cm, 8', 1MHz, posterior L Shoulder  IONTO #4 - 4 hour patch with 59m/min dex to L posteriolateral shoulder. Pt advised to remove at 12PM or earlier if notes any discomfort.                      PT Short Term Goals - 12/16/14 0908    PT SHORT TERM GOAL #1   Title pt independent with initial HEP by 12/16/14   Status Achieved           PT Long Term Goals - 12/30/14 0827    PT LONG TERM GOAL #1   Title  pt demonstrated / verbalizes strong understanding of posture and body mechanics to decrease likelihood of reinjury by 01/07/15   Status Achieved   PT LONG TERM GOAL #2   Title pt independent with advanced HEP to include return to gym based exercise by 01/07/15   Status On-going   PT LONG TERM GOAL #3   Title L Shoulder AROM and MMT equal to R by 01/07/15   Status Partially Met   PT LONG TERM GOAL #4   Title pt states pain is infrequent in nature (less than 20% of time) and does not interfer with ADLs, work, or driving by 36/96/29  Status On-going               Plan - 01/15/15 0801    Clinical Impression Statement seems to be progressing well, cont POC   PT Next Visit Plan progress as tolerated        Problem List Patient Active Problem List   Diagnosis Date Noted  . Acne 12/03/2012  . Left shoulder pain 11/05/2012  . Neck stiffness 11/05/2012  .  Routine general medical examination at a health care facility 07/30/2012  . IBS (irritable bowel syndrome) 07/17/2012  . Depression 07/17/2012  . Hyperlipidemia 07/17/2012    Jemarcus Dougal PT, OCS 01/15/2015, 8:03 AM  [facility] [address]  [place], [place], [address] Phone: [phone]   Fax:  [phone]

## 2015-01-19 ENCOUNTER — Ambulatory Visit: Payer: BLUE CROSS/BLUE SHIELD | Admitting: Physical Therapy

## 2015-01-19 DIAGNOSIS — M25512 Pain in left shoulder: Secondary | ICD-10-CM

## 2015-01-19 NOTE — Therapy (Signed)
[facility] [address]  [place], [place], [address] Phone: [phone]   Fax:  [phone]  Physical Therapy Treatment  Patient Details  Name: Manuel Patton MRN: [id number] Date of Birth: 1981/09/25 Referring Provider:  Silverio Decamp,*  Encounter Date: 01/19/2015      PT End of Session - 01/19/15 0720    Visit Number 9   Number of Visits 12   Date for PT Re-Evaluation 02/02/15   PT Start Time 0718   PT Stop Time 0753   PT Time Calculation (min) 35 min      Past Medical History  Diagnosis Date  . History of chicken pox   . Lactose intolerance     Past Surgical History  Procedure Laterality Date  . Tonsillectomy and adenoidectomy  2001  . Wisdom tooth extraction      There were no vitals filed for this visit.  Visit Diagnosis:  Left shoulder pain      Subjective Assessment - 01/19/15 0720    Symptoms states was able to go for a run without noting increase in shoulder pain. Is working out at gym to include some upper body resistance training (pulling exercises) without increased pain.   Currently in Pain? No/denies         TODAY'S TREATMENT:  THEREX - UBE LVL 3.0 90"/90" Body Blade L ER/IR, Flexion, Abduction 30" all neutral shoulder, no pain Body Blade B Flexion with movement into Flexion full AROM 6x Body Blade L ER/IR with movement at 90 ABD 5x (fatigue without pain) Body Blade L ABD in R Side-Lying over PBall (65cm) 6x TRX Push-up 2x15 (distance kept easy, no pain or clicking) TRX High Row x15 Plank with Elbows on PBall with small roll in/out (65cm) 2x50"  [place] - 20%, 0.5W/cm2, 5cm, 8', 1MHz, posterior L Shoulder  IONTO #5 - 4 hour patch with 24m/min dex to L posteriolateral shoulder. Pt advised to remove at 12PM or earlier if notes any discomfort.                        PT Short Term Goals - 12/16/14 0908    PT SHORT TERM GOAL #1   Title pt  independent with initial HEP by 12/16/14   Status Achieved           PT Long Term Goals - 12/30/14 0827    PT LONG TERM GOAL #1   Title pt demonstrated / verbalizes strong understanding of posture and body mechanics to decrease likelihood of reinjury by 01/07/15   Status Achieved   PT LONG TERM GOAL #2   Title pt independent with advanced HEP to include return to gym based exercise by 01/07/15   Status On-going   PT LONG TERM GOAL #3   Title L Shoulder AROM and MMT equal to R by 01/07/15   Status Partially Met   PT LONG TERM GOAL #4   Title pt states pain is infrequent in nature (less than 20% of time) and does not interfer with ADLs, work, or driving by 36/96/29  Status On-going               Plan - 01/19/15 0755    Clinical Impression Statement good progress, no changes needed, one more ionto patch        Problem List Patient Active Problem List   Diagnosis Date Noted  . Acne 12/03/2012  . Left shoulder pain 11/05/2012  .  Neck stiffness 11/05/2012  . Routine general medical examination at a health care facility 07/30/2012  . IBS (irritable bowel syndrome) 07/17/2012  . Depression 07/17/2012  . Hyperlipidemia 07/17/2012    Herald Vallin PT, OCS 01/19/2015, 7:56 AM  [facility] [address]  [place], [place], [address] Phone: [phone]   Fax:  [phone]

## 2015-01-21 ENCOUNTER — Encounter: Payer: Self-pay | Admitting: Physical Therapy

## 2015-01-21 ENCOUNTER — Ambulatory Visit: Payer: BLUE CROSS/BLUE SHIELD | Admitting: Physical Therapy

## 2015-01-21 DIAGNOSIS — M25512 Pain in left shoulder: Secondary | ICD-10-CM | POA: Diagnosis not present

## 2015-01-21 NOTE — Therapy (Addendum)
[facility] [address]  [place], [place], [address] Phone: [phone]   Fax:  [phone]  Physical Therapy Treatment  Patient Details  Name: Manuel Patton MRN: [id number] Date of Birth: 11-03-1980 Referring Provider:  Silverio Decamp,*  Encounter Date: 01/21/2015      PT End of Session - 01/21/15 0727    Visit Number 10   Number of Visits 12   Date for PT Re-Evaluation 02/02/15   PT Start Time 0720   PT Stop Time 0801   PT Time Calculation (min) 41 min      Past Medical History  Diagnosis Date  . History of chicken pox   . Lactose intolerance     Past Surgical History  Procedure Laterality Date  . Tonsillectomy and adenoidectomy  2001  . Wisdom tooth extraction      There were no vitals filed for this visit.  Visit Diagnosis:  Left shoulder pain      Subjective Assessment - 01/21/15 0724    Symptoms No increased pain lately, no complaints.  Is able to avoid painful activities at this time to include gym exercises that include pressing (chest press or overhead press), driving with L UE on top of steering wheel.   Currently in Pain? No/denies            TODAY'S TREATMENT:  THEREX - UBE LVL 3.0 90"/90" Low Row 55# 15x, 75# 2x15 Body Blade D2 throwing pattern 8x Body Blade ABD 0-90 10x Standing B Shoulder ABD 6# 15x   TRX Push-up 2x15 (distance kept easy, no pain or clicking) TRX High Row x15  [place] - 20%, 0.5W/cm2, 5cm, 8', 1MHz, posterior L Shoulder  IONTO #6 - 4 hour patch with 35m/min dex to L posteriolateral shoulder. Pt advised to remove at 12PM or earlier if notes any discomfort.                     PT Short Term Goals - 12/16/14 0908    PT SHORT TERM GOAL #1   Title pt independent with initial HEP by 12/16/14   Status Achieved           PT Long Term Goals - 01/21/15 0730    PT LONG TERM GOAL #1   Title pt demonstrated / verbalizes strong  understanding of posture and body mechanics to decrease likelihood of reinjury by 01/07/15   Status Achieved   PT LONG TERM GOAL #2   Title pt independent with advanced HEP to include return to gym based exercise by 01/07/15   Status Achieved   PT LONG TERM GOAL #3   Title L Shoulder AROM and MMT equal to R by 01/07/15  slight weakness persists L shoulder   Status Partially Met   PT LONG TERM GOAL #4   Title pt states pain is infrequent in nature (less than 20% of time) and does not interfer with ADLs, work, or driving by 39/89/21 does not interfere with activities due to combination of improved pain as well as modified mechanics with these activities.   Status Partially Met               Plan - 01/21/15 1157    Clinical Impression Statement questions answered today regarding gym progressions.  Pt has met majority of goals and is independent with progressions.  At this point with modified level of L UE use pt typically pain-free.  With gradual independent progressions  he may be able to return to prior level of function without surgery or further medical intervention.  He is to contact me if any additional questions or PT needs; otherwise, pt is being placed on hold from PT as he progresses on his own.  If he does not return to PT within the next 30 days I will discharge him from my care.   Consulted and Agree with Plan of Care Patient        Problem List Patient Active Problem List   Diagnosis Date Noted  . Acne 12/03/2012  . Left shoulder pain 11/05/2012  . Neck stiffness 11/05/2012  . Routine general medical examination at a health care facility 07/30/2012  . IBS (irritable bowel syndrome) 07/17/2012  . Depression 07/17/2012  . Hyperlipidemia 07/17/2012    Manuel Patton PT, OCS 01/21/2015, 1:11 PM  [facility] [address]  [place], [place], [address] Phone: [phone]   Fax:  [phone]    PHYSICAL  THERAPY DISCHARGE SUMMARY  Visits from Start of Care: 10  Current functional level related to goals / functional outcomes: 27% limitation per FOTO on 01/25/15   Remaining deficits: Intermittent L shoulder pain, mild L UE weakness   Education / Equipment: HEP and gym progression  Plan: Patient agrees to discharge.  Patient goals were met. Patient is being discharged due to meeting the stated rehab goals.  ?????       [name] last treated on 01/21/15 after which he was placed on hold while he performed his HEP and participated in gym workouts.  He was to contact me if his symptoms increased or if he had further questions.  I have not heard from him and I am therefore discharging him from my care at this time.  Manuel Patton PT, OCS 02/22/2015 2:27 PM

## 2015-02-23 ENCOUNTER — Other Ambulatory Visit: Payer: Self-pay | Admitting: Family

## 2015-03-02 ENCOUNTER — Ambulatory Visit (INDEPENDENT_AMBULATORY_CARE_PROVIDER_SITE_OTHER): Payer: BLUE CROSS/BLUE SHIELD | Admitting: Family

## 2015-03-02 ENCOUNTER — Encounter: Payer: Self-pay | Admitting: Family

## 2015-03-02 VITALS — BP 102/78 | HR 67 | Temp 97.9°F | Resp 16 | Ht 71.5 in | Wt 150.0 lb

## 2015-03-02 DIAGNOSIS — F329 Major depressive disorder, single episode, unspecified: Secondary | ICD-10-CM

## 2015-03-02 DIAGNOSIS — F32A Depression, unspecified: Secondary | ICD-10-CM

## 2015-03-02 MED ORDER — VENLAFAXINE HCL 75 MG PO TABS: ORAL_TABLET | ORAL | Status: DC

## 2015-03-02 MED ORDER — SERTRALINE HCL 50 MG PO TABS: ORAL_TABLET | ORAL | Status: DC

## 2015-03-02 NOTE — Progress Notes (Signed)
   Subjective:    Patient ID: Manuel Patton, male    DOB: 07-04-1981, 34 y.o.   MRN: [id number]  HPI  [name] is a 34 yr old male who presents today for for follow up of his depression.  Last visit his effexor dose was increased.  Reports that his mood is improved with this higher dose.  Feels more motivated through the day.  He is sleeping better. Denies associated side effects. He also continues zoloft.    Completed PT for his shoulder.  Has started going back to the gyn.    Review of Systems See HPI  Past Medical History  Diagnosis Date  . History of chicken pox   . Lactose intolerance     History   Social History  . Marital Status: Single    Spouse Name: N/A  . Number of Children: N/A  . Years of Education: N/A   Occupational History  . Not on file.   Social History Main Topics  . Smoking status: Former Smoker    Quit date: 05/30/2013  . Smokeless tobacco: Never Used     Comment: 2 cigars a year  . Alcohol Use: No     Comment: No alcohol in 1 year  . Drug Use: No  . Sexual Activity: Not on file   Other Topics Concern  . Not on file   Social History Narrative   Regular exercise:  Roller blade/skating 2-3 x weekly   Caffeine use:  Occasional tea.   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 Tonsillectomy and adenoidectomy  2001  . Wisdom tooth extraction      Family History  Problem Relation Age of Onset  . Stroke Maternal Grandmother   . Hypertension Other   . Hyperlipidemia Other     Only knows that htn and hyperlipidemia run  on his dad's side  . Hyperlipidemia Father   . Heart attack Neg Hx   . Diabetes Neg Hx   . Sudden death Neg Hx     No Known Allergies  Current Outpatient Prescriptions on File Prior to Visit  Medication Sig Dispense Refill  . meloxicam (MOBIC) 15 MG tablet One tab PO qAM with  breakfast for 2 weeks, then daily prn pain. 30 tablet 3  . ORACEA 40 MG capsule Take 40 mg by mouth daily.     No current facility-administered medications on file prior to visit.    BP 102/78 mmHg  Pulse 67  Temp(Src) 97.9 F (36.6 C) (Oral)  Resp 16  Ht 5' 11.5" (1.816 m)  Wt 150 lb (68.04 kg)  BMI 20.63 kg/m2  SpO2 99%       Objective:   Physical Exam  Constitutional: He is oriented to person, place, and time. He appears well-developed and well-nourished. No distress.  Cardiovascular: Normal rate and regular rhythm.   No murmur heard. Pulmonary/Chest: Effort normal and breath sounds normal. No respiratory distress. He has no wheezes. He has no rales.  Musculoskeletal: He exhibits no edema.  Neurological: He is alert and oriented to person, place, and time.  Psychiatric: He has a normal mood and affect. His behavior is normal. Thought content normal.          Assessment & Plan:

## 2015-03-02 NOTE — Assessment & Plan Note (Signed)
Improved, continue zoloft and effexor.

## 2015-03-02 NOTE — Patient Instructions (Signed)
Continue current meds.   Follow up in 6 months, sooner if problems/concerns.  

## 2015-03-02 NOTE — Progress Notes (Signed)
Pre visit review using our clinic review tool, if applicable. No additional management support is needed unless otherwise documented below in the visit note. 

## 2015-05-31 ENCOUNTER — Other Ambulatory Visit: Payer: Self-pay | Admitting: Family

## 2015-09-03 ENCOUNTER — Encounter: Payer: Self-pay | Admitting: Family

## 2015-09-03 ENCOUNTER — Other Ambulatory Visit: Payer: Self-pay | Admitting: Family

## 2015-09-03 ENCOUNTER — Ambulatory Visit (INDEPENDENT_AMBULATORY_CARE_PROVIDER_SITE_OTHER): Payer: BLUE CROSS/BLUE SHIELD | Admitting: Family

## 2015-09-03 VITALS — BP 115/71 | HR 70 | Temp 98.4°F | Resp 16 | Ht 71.5 in | Wt 152.8 lb

## 2015-09-03 DIAGNOSIS — F329 Major depressive disorder, single episode, unspecified: Secondary | ICD-10-CM

## 2015-09-03 DIAGNOSIS — Z113 Encounter for screening for infections with a predominantly sexual mode of transmission: Secondary | ICD-10-CM | POA: Diagnosis not present

## 2015-09-03 DIAGNOSIS — F32A Depression, unspecified: Secondary | ICD-10-CM

## 2015-09-03 MED ORDER — VENLAFAXINE HCL 75 MG PO TABS: ORAL_TABLET | ORAL | Status: DC

## 2015-09-03 MED ORDER — SERTRALINE HCL 50 MG PO TABS: ORAL_TABLET | ORAL | Status: DC

## 2015-09-03 NOTE — Assessment & Plan Note (Signed)
Stable on zoloft and effexor.  Continue same.

## 2015-09-03 NOTE — Patient Instructions (Signed)
Please complete lab work prior to leaving.  Continue current medications.   

## 2015-09-03 NOTE — Progress Notes (Signed)
Pre visit review using our clinic review tool, if applicable. No additional management support is needed unless otherwise documented below in the visit note. 

## 2015-09-03 NOTE — Progress Notes (Signed)
   Subjective:    Patient ID: Manuel Patton, male    DOB: 1981/08/31, 34 y.o.   MRN: [id number]  HPI  Manuel Patton is a 34 yr old male who presents today for follow up of his depression.  He continues zoloft and effexor.  He reports that overall mood has been good.  Feeling better than he did last visit.  Had a nice summer- saw family and travelled to [place].   He is requesting STD screening for screening only- asymptomatic.   Review of Systems See HPI  Past Medical History  Diagnosis Date  . History of chicken pox   . Lactose intolerance     Social History   Social History  . Marital Status: Single    Spouse Name: N/A  . Number of Children: N/A  . Years of Education: N/A   Occupational History  . Not on file.   Social History Main Topics  . Smoking status: Former Smoker    Quit date: 05/30/2013  . Smokeless tobacco: Never Used     Comment: 2 cigars a year  . Alcohol Use: No     Comment: No alcohol in 1 year  . Drug Use: No  . Sexual Activity: Not on file   Other Topics Concern  . Not on file   Social History Narrative   Regular exercise:  Roller blade/skating 2-3 x weekly   Caffeine use:  Occasional tea.   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 Tonsillectomy and adenoidectomy  2001  . Wisdom tooth extraction      Family History  Problem Relation Age of Onset  . Stroke Maternal Grandmother   . Hypertension Other   . Hyperlipidemia Other     Only knows that htn and hyperlipidemia run  on his dad's side  . Hyperlipidemia Father   . Heart attack Neg Hx   . Diabetes Neg Hx   . Sudden death Neg Hx     No Known Allergies  Current Outpatient Prescriptions on File Prior to Visit  Medication Sig Dispense Refill  . meloxicam (MOBIC) 15 MG tablet One tab PO qAM with breakfast for 2 weeks, then daily prn pain. 30 tablet 3    . ORACEA 40 MG capsule Take 40 mg by mouth daily.     No current facility-administered medications on file prior to visit.    BP 115/71 mmHg  Pulse 70  Temp(Src) 98.4 F (36.9 C) (Oral)  Resp 16  Ht 5' 11.5" (1.816 m)  Wt 152 lb 12.8 oz (69.31 kg)  BMI 21.02 kg/m2  SpO2 100%       Objective:   Physical Exam  Constitutional: He is oriented to person, place, and time. He appears well-developed and well-nourished. No distress.  HENT:  Head: Normocephalic and atraumatic.  Musculoskeletal: He exhibits no edema.  Neurological: He is alert and oriented to person, place, and time.  Psychiatric: He has a normal mood and affect. His behavior is normal. Thought content normal.          Assessment & Plan:  STD screening-  Obtain HIV, GC/Chlamydia, RPR, HSV testing

## 2015-09-04 LAB — HIV ANTIBODY (ROUTINE TESTING W REFLEX): HIV 1&2 Ab, 4th Generation: NONREACTIVE

## 2015-09-04 LAB — RPR

## 2015-09-04 LAB — GC PROBE AMPLIFICATION, URINE: GC PROBE AMP, URINE: NEGATIVE

## 2015-09-06 LAB — HSV 1 ANTIBODY, IGG

## 2015-09-06 LAB — HSV 2 ANTIBODY, IGG: HSV 2 Glycoprotein G Ab, IgG: <0.1 IV

## 2015-09-06 LAB — HSV(HERPES SIMPLEX VRS) I + II AB-IGM: Herpes Simplex Vrs I&II-IgM Ab (EIA): 1.09 INDEX

## 2015-09-08 ENCOUNTER — Telehealth: Payer: Self-pay | Admitting: *Deleted

## 2015-09-08 ENCOUNTER — Encounter: Payer: Self-pay | Admitting: Family

## 2015-09-08 NOTE — Telephone Encounter (Signed)
Spoke with Alcar[facility] Drought[facility]rica at [facility] and added test.  Awaiting result.

## 2015-09-08 NOTE — Telephone Encounter (Signed)
-----   Message from Sandford CrazeMelissa O'Sullivan, NP sent at 09/08/2015  7:39 AM EST ----- Please hold off on mailing his letter, I see that only gonorrhea was run, can we add on chlamydia to the urine sample or is it too late?

## 2015-09-09 ENCOUNTER — Encounter: Payer: Self-pay | Admitting: *Deleted

## 2015-09-09 LAB — CHLAMYDIA TRACHOMATIS RNA, URINE: Chlamydia, Swab/Urine, PCR: NEGATIVE

## 2015-09-30 ENCOUNTER — Other Ambulatory Visit: Payer: Self-pay | Admitting: Family

## 2015-10-11 ENCOUNTER — Telehealth: Payer: Self-pay | Admitting: Family Medicine

## 2015-10-11 DIAGNOSIS — T148XXA Other injury of unspecified body region, initial encounter: Secondary | ICD-10-CM | POA: Insufficient documentation

## 2015-10-11 MED ORDER — CEFUROXIME AXETIL 500 MG PO TABS: 500.0000 mg | ORAL_TABLET | Freq: Two times a day (BID) | ORAL | Status: DC

## 2015-10-11 MED ORDER — DOXYCYCLINE HYCLATE 100 MG PO TABS: 100.0000 mg | ORAL_TABLET | Freq: Two times a day (BID) | ORAL | Status: DC

## 2015-10-11 NOTE — Telephone Encounter (Signed)
Patient suffered puncture wound to elbow on Saturday. It's become more painful. He has an appointment tomorrow. He ready took some leftover Bactrim. Plan to call in antibiotics empirically and patient will follow-up with myself tomorrow.

## 2015-10-12 ENCOUNTER — Encounter: Payer: Self-pay | Admitting: Family Medicine

## 2015-10-12 ENCOUNTER — Ambulatory Visit (INDEPENDENT_AMBULATORY_CARE_PROVIDER_SITE_OTHER): Payer: BLUE CROSS/BLUE SHIELD | Admitting: Family Medicine

## 2015-10-12 VITALS — BP 116/70 | HR 68 | Wt 157.0 lb

## 2015-10-12 DIAGNOSIS — T148XXA Other injury of unspecified body region, initial encounter: Secondary | ICD-10-CM

## 2015-10-12 DIAGNOSIS — T148 Other injury of unspecified body region: Secondary | ICD-10-CM | POA: Diagnosis not present

## 2015-10-12 NOTE — Assessment & Plan Note (Addendum)
High risk for development of septic olecranon bursitis. Plan for aggressive prophylactic antibiotics Continue Ceftin and Doxycycline. Counseled on concerning symptoms. Return to care in one week. Tetanus up-to-date.

## 2015-10-12 NOTE — Patient Instructions (Signed)
Thank you for coming in today. You were seen today for right elbow injury.  Please continue taking your antibiotics. Take doxycycline without dairy products, iron or multivitamins in your stomach. Return to care in one week if not improved or if you notice streaks, fever chills, pus coming out of the wound.

## 2015-10-12 NOTE — Progress Notes (Signed)
Manuel Patton is a 34 y.o. male who [facility] today for right elbow puncture wound.   Patient suffered puncture wound to his right olecranon on Saturday when scaling a rusty fence. Lesion was exquisitely painful Sunday and Monday. The pain is much improved today. He had wound washed out with alcohol by his medical assistant roommate and had taken "a couple" of doses of old Bactrim. Patient had Ceftin and Doxycyline called in last night which he took last night and this morning. He had a Tetanus shot earlier this year. He has not noted any spreading erythema, fever chills or purulence. He notes that the pain is improving.    Past Medical History  Diagnosis Date  . History of chicken pox   . Lactose intolerance    Past Surgical History  Procedure Laterality Date  . Tonsillectomy and adenoidectomy  2001  . Wisdom tooth extraction     Social History  Substance Use Topics  . Smoking status: Former Smoker    Quit date: 05/30/2013  . Smokeless tobacco: Never Used     Comment: 2 cigars a year  . Alcohol Use: No     Comment: No alcohol in 1 year   family history includes Hyperlipidemia in his father and other; Hypertension in his other; Stroke in his maternal grandmother. There is no history of Heart attack, Diabetes, or Sudden death.  ROS as above Medications: Current Outpatient Prescriptions  Medication Sig Dispense Refill  . cefUROXime (CEFTIN) 500 MG tablet Take 1 tablet (500 mg total) by mouth 2 (two) times daily with a meal. 14 tablet 0  . doxycycline (VIBRA-TABS) 100 MG tablet Take 1 tablet (100 mg total) by mouth 2 (two) times daily. 14 tablet 0  . ORACEA 40 MG capsule Take 40 mg by mouth daily.    . sertraline (ZOLOFT) 50 MG tablet TAKE 1 TABLET BY MOUTH EVERY DAY 90 tablet 1  . sertraline (ZOLOFT) 50 MG tablet TAKE 1 TABLET BY MOUTH EVERY DAY 90 tablet 0  . venlafaxine (EFFEXOR) 75 MG tablet TAKE 1 TABLET (75 MG TOTAL) BY  MOUTH 2 (TWO) TIMES DAILY. 120 tablet 1   No current facility-administered medications for this visit.   No Known Allergies   Exam:  BP 116/70 mmHg  Pulse 68  Wt 157 lb (71.215 kg) Gen: Well- appearing gentleman in NAD seated on exam table  HEENT: EOMI,  MMM Lungs: Normal work of breathing. CTABL Heart: RRR no MRG Abd: NABS, Soft. Nondistended, Nontender Exts: Brisk capillary refill, warm and well perfused. 4cm erythematous lesion, well scabbed. No active bleeding. No pus when palpated.  Non-tender. No induration or fluctuance.    No results found for this or any previous visit (from the past 24 hour(s)). No results found.   Please see individual assessment and plan sections.

## 2015-11-01 ENCOUNTER — Other Ambulatory Visit: Payer: Self-pay | Admitting: Family

## 2015-11-12 ENCOUNTER — Telehealth: Payer: Self-pay | Admitting: *Deleted

## 2015-11-12 MED ORDER — SERTRALINE HCL 50 MG PO TABS: 50.0000 mg | ORAL_TABLET | Freq: Every day | ORAL | Status: DC

## 2015-11-12 MED ORDER — VENLAFAXINE HCL 75 MG PO TABS: ORAL_TABLET | ORAL | Status: DC

## 2015-11-12 NOTE — Telephone Encounter (Signed)
Received fax from [facility] requesting refills of venlafaxine HCL 75mg  and sertraline HCL 50mg . Refills sent.

## 2015-11-13 ENCOUNTER — Encounter: Payer: Self-pay | Admitting: Emergency Medicine

## 2015-11-13 ENCOUNTER — Emergency Department
Admission: EM | Admit: 2015-11-13 | Discharge: 2015-11-13 | Disposition: A | Discharge status: Home / Self Care | Payer: BLUE CROSS/BLUE SHIELD | Source: Home / Self Care | Attending: Family Medicine | Admitting: Family Medicine

## 2015-11-13 DIAGNOSIS — L089 Local infection of the skin and subcutaneous tissue, unspecified: Secondary | ICD-10-CM | POA: Diagnosis not present

## 2015-11-13 DIAGNOSIS — A499 Bacterial infection, unspecified: Secondary | ICD-10-CM | POA: Diagnosis not present

## 2015-11-13 DIAGNOSIS — M7021 Olecranon bursitis, right elbow: Secondary | ICD-10-CM | POA: Diagnosis not present

## 2015-11-13 DIAGNOSIS — B9689 Other specified bacterial agents as the cause of diseases classified elsewhere: Secondary | ICD-10-CM

## 2015-11-13 MED ORDER — SULFAMETHOXAZOLE-TRIMETHOPRIM 800-160 MG PO TABS: 1.0000 | ORAL_TABLET | Freq: Two times a day (BID) | ORAL | Status: AC

## 2015-11-13 MED ORDER — CEPHALEXIN 500 MG PO CAPS: 500.0000 mg | ORAL_CAPSULE | Freq: Two times a day (BID) | ORAL | Status: DC

## 2015-11-13 MED ORDER — SULFAMETHOXAZOLE-TRIMETHOPRIM 800-160 MG PO TABS: 1.0000 | ORAL_TABLET | Freq: Two times a day (BID) | ORAL | Status: DC

## 2015-11-13 NOTE — Discharge Instructions (Signed)
Please take antibiotics as prescribed and be sure to complete entire course even if you start to feel better to ensure infection does not come back.   Bursitis Bursitis is inflammation and irritation of a bursa, which is one of the small, fluid-filled sacs that cushion and protect the moving parts of your body. These sacs are located between bones and muscles, muscle attachments, or skin areas next to bones. A bursa protects these structures from the wear and tear that results from frequent movement. An inflamed bursa causes pain and swelling. Fluid may build up inside the sac. Bursitis is most common near joints, especially the knees, elbows, hips, and shoulders. CAUSES Bursitis can be caused by:   Injury from:  A direct blow, like falling on your knee or elbow.  Overuse of a joint (repetitive stress).  Infection. This can happen if bacteria gets into a bursa through a cut or scrape near a joint.  Diseases that cause joint inflammation, such as gout and rheumatoid arthritis. RISK FACTORS You may be at risk for bursitis if you:   Have a job or hobby that involves a lot of repetitive stress on your joints.  Have a condition that weakens your body's defense system (immune system), such as diabetes, cancer, or HIV.  Lift and reach overhead often.  Kneel or lean on hard surfaces often.  Run or walk often. SIGNS AND SYMPTOMS The most common signs and symptoms of bursitis are:  Pain that gets worse when you move the affected body part or put weight on it.  Inflammation.  Stiffness. Other signs and symptoms may include:  Redness.  Tenderness.  Warmth.  Pain that continues after rest.  Fever and chills. This may occur in bursitis caused by infection. DIAGNOSIS Bursitis may be diagnosed by:   Medical history and physical exam.  MRI.  A procedure to drain fluid from the bursa with a needle (aspiration). The fluid may be checked for signs of infection or gout.  Blood  tests to rule out other causes of inflammation. TREATMENT  Bursitis can usually be treated at home with rest, ice, compression, and elevation (RICE). For mild bursitis, RICE treatment may be all you need. Other treatments may include:  Nonsteroidal anti-inflammatory drugs (NSAIDs) to treat pain and inflammation.  Corticosteroids to fight inflammation. You may have these drugs injected into and around the area of bursitis.  Aspiration of bursitis fluid to relieve pain and improve movement.  Antibiotic medicine to treat an infected bursa.  A splint, brace, or walking aid.  Physical therapy if you continue to have pain or limited movement.  Surgery to remove a damaged or infected bursa. This may be needed if you have a very bad case of bursitis or if other treatments have not worked. HOME CARE INSTRUCTIONS   Take medicines only as directed by your health care provider.  If you were prescribed an antibiotic medicine, finish it all even if you start to feel better.  Rest the affected area as directed by your health care provider.  Keep the area elevated.  Avoid activities that make pain worse.  Apply ice to the injured area:  Place ice in a plastic bag.  Place a towel between your skin and the bag.  Leave the ice on for 20 minutes, 2-3 times a day.  Use splints, braces, pads, or walking aids as directed by your health care provider.  Keep all follow-up visits as directed by your health care provider. This is important. PREVENTION  Wear knee pads if you kneel often.  Wear sturdy running or walking shoes that fit you well.  Take regular breaks from repetitive activity.  Warm up by stretching before doing any strenuous activity.  Maintain a healthy weight or lose weight as recommended by your health care provider. Ask your health care provider if you need help.  Exercise regularly. Start any new physical activity gradually. SEEK MEDICAL CARE IF:   Your bursitis is not  responding to treatment or home care.  You have a fever.  You have chills.   This information is not intended to replace advice given to you by your health care provider. Make sure you discuss any questions you have with your health care provider.   Document Released: 10/13/2000 Document Revised: 07/07/2015 Document Reviewed: 01/05/2014 Elsevier Interactive Patient Education [employer]

## 2015-11-13 NOTE — ED Notes (Signed)
Patient presents to [facility] for a check of a wound to his right elbow that happened on Dec.10 th. after a fall. Seen Dr. Denyse Amassorey and was treated with two different antibiotics patient states that over the last two to three days that he has noticed some swelling and the area was painful to touch. Denies pain otherwise. Elbow is slightly red and appears to have a small amount of edema present.

## 2015-11-13 NOTE — ED Provider Notes (Signed)
CSN: [id number]     Arrival date & time 11/13/15  1457 History   First MD Initiated Contact with Patient 11/13/15 1501     Chief Complaint  Patient presents with  . Wound Infection   (Consider location/radiation/quality/duration/timing/severity/associated sxs/prior Treatment) HPI Pt is a 34yo male instructed to come to [facility] by Dr. Denyse Amass, for further evaluation of Right elbow pain, swelling and redness.  Pt was seen initially by Dr. Denyse Amass on 10/09/15 after a fall and infected wound on Right elbow.  He was treated with Doxycycline and Ceftin at that time.  Redness, pain and swelling improved significantly, however, over the last 2-3 days he noticed worsening welling and pain at the same area on his elbow again. Denies new injuries.  Pain is aching and sore to the touch but otherwise no pain at rest or movement. Denies fever, chills, n/v/d. No bleeding or discharge from the wound.   Past Medical History  Diagnosis Date  . History of chicken pox   . Lactose intolerance    Past Surgical History  Procedure Laterality Date  . Tonsillectomy and adenoidectomy  2001  . Wisdom tooth extraction     Family History  Problem Relation Age of Onset  . Stroke Maternal Grandmother   . Hypertension Other   . Hyperlipidemia Other     Only knows that htn and hyperlipidemia run  on his dad's side  . Hyperlipidemia Father   . Heart attack Neg Hx   . Diabetes Neg Hx   . Sudden death Neg Hx    Social History  Substance Use Topics  . Smoking status: Former Smoker    Quit date: 05/30/2013  . Smokeless tobacco: Never Used     Comment: 2 cigars a year  . Alcohol Use: No     Comment: No alcohol in 1 year    Review of Systems  Constitutional: Negative for fever and chills.  Musculoskeletal: Positive for myalgias, joint swelling and arthralgias.       Right elbow  Skin: Positive for color change and wound.    Allergies  Review of patient's allergies indicates no known allergies.  Home Medications    Prior to Admission medications   Medication Sig Start Date End Date Taking? Authorizing Provider  cefUROXime (CEFTIN) 500 MG tablet Take 1 tablet (500 mg total) by mouth 2 (two) times daily with a meal. 10/11/15   Rodolph Bong, MD  cephALEXin (KEFLEX) 500 MG capsule Take 1 capsule (500 mg total) by mouth 2 (two) times daily. For 7 days 11/13/15   Junius Finner, PA-C  doxycycline (VIBRA-TABS) 100 MG tablet Take 1 tablet (100 mg total) by mouth 2 (two) times daily. 10/11/15   Rodolph Bong, MD  ORACEA 40 MG capsule Take 40 mg by mouth daily. 05/12/13   Historical Provider, MD  sertraline (ZOLOFT) 50 MG tablet Take 1 tablet (50 mg total) by mouth daily. 11/12/15   Sandford Craze, NP  sulfamethoxazole-trimethoprim (BACTRIM DS,SEPTRA DS) 800-160 MG tablet Take 1 tablet by mouth 2 (two) times daily. 11/13/15 11/20/15  Junius Finner, PA-C  venlafaxine (EFFEXOR) 75 MG tablet TAKE 1 TABLET (75 MG TOTAL) BY MOUTH 2 (TWO) TIMES DAILY. 11/12/15   Sandford Craze, NP   Meds Ordered and Administered this Visit  Medications - No data to display  BP 114/69 mmHg  Pulse 62  Temp(Src) 98.1 F (36.7 C) (Oral)  Resp 16  Ht 5\' 11"  (1.803 m)  Wt 155 lb (70.308 kg)  BMI 21.63 kg/m2  SpO2 100% No data found.   Physical Exam  Constitutional: He is oriented to person, place, and time. He appears well-developed and well-nourished.  HENT:  Head: Normocephalic and atraumatic.  Eyes: EOM are normal.  Neck: Normal range of motion.  Cardiovascular: Normal rate.   Pulses:      Radial pulses are 2+ on the right side.  Right hand: cap refill < 3 seconds  Pulmonary/Chest: Effort normal.  Musculoskeletal: Normal range of motion. He exhibits edema and tenderness.  Right elbow: full ROM. Mild to moderate edema of olecranon bursa. Mild tenderness to center of bursa over abrasion (see skin exam) 5/5 strength in Right arm compared to Left.  Neurological: He is alert and oriented to person, place, and time.  Right  arm: normal sensation  Skin: Skin is warm and dry. There is erythema.  Right elbow: edematous olecranon bursa with fluctuance. Centralized area of abrasion 0.5cm with minimal amount of surrounding erythema.  Tender to touch over abrasion.  No red streaking. No bleeding or discharge.     Psychiatric: He has a normal mood and affect. His behavior is normal.  Nursing note and vitals reviewed.   ED Course  Procedures (including critical care time)  Labs Review Labs Reviewed - No data to display  Imaging Review No results found.    MDM   1. Olecranon bursitis of right elbow   2. Skin infection, bacterial     Pt c/o worsening Right elbow pain, redness and swelling following an injury that occurred about 1 month ago from a fall.  No systemic symptoms. No evidence of septic joint at this time.   Consulted with Dr. Denyse Amassorey who is familiar with pt.   Will place pt on different antibiotics and use compression with elbow sleeve or Ace wrap to help with swelling. Advised to f/u with Dr. Denyse Amassorey in 2-3 days for recheck of symptoms.  Rx: Bactrim and Keflex.  Patient verbalized understanding and agreement with treatment plan.    Junius FinnerErin O'Malley, PA-C 11/13/15 [phone]

## 2015-11-15 ENCOUNTER — Encounter: Payer: Self-pay | Admitting: Family Medicine

## 2015-11-15 ENCOUNTER — Ambulatory Visit (INDEPENDENT_AMBULATORY_CARE_PROVIDER_SITE_OTHER): Payer: BLUE CROSS/BLUE SHIELD | Admitting: Family Medicine

## 2015-11-15 VITALS — BP 107/66 | HR 66 | Wt 159.0 lb

## 2015-11-15 DIAGNOSIS — L03113 Cellulitis of right upper limb: Secondary | ICD-10-CM

## 2015-11-15 DIAGNOSIS — M7020 Olecranon bursitis, unspecified elbow: Secondary | ICD-10-CM | POA: Insufficient documentation

## 2015-11-15 DIAGNOSIS — M7021 Olecranon bursitis, right elbow: Secondary | ICD-10-CM

## 2015-11-15 DIAGNOSIS — L0390 Cellulitis, unspecified: Secondary | ICD-10-CM | POA: Insufficient documentation

## 2015-11-15 NOTE — Assessment & Plan Note (Signed)
Resolving cellulitis right elbow. Continue oral antibiotics. Follow-up if not improving. Doubtful for septic olecranon bursitis.

## 2015-11-15 NOTE — Progress Notes (Signed)
       Manuel MylarLawrence T Kelso is a 35 y.o. male who [facility] today for follow-up olecranon bursitis.  Patient was seen on December 13 for puncture wound to his right elbow. He was treated with empiric antibiotics and did pretty well. A few days ago he noted continued swelling of the olecranon and a small red bump. He was seen in urgent care where he was given Keflex and Bactrim. He notes a small red bump is improving and not particularly painful. He still notes diffuse squishy softness at the olecranon on the right side. He continues his normal activities.   Past Medical History  Diagnosis Date  . History of chicken pox   . Lactose intolerance    Past Surgical History  Procedure Laterality Date  . Tonsillectomy and adenoidectomy  2001  . Wisdom tooth extraction     Social History  Substance Use Topics  . Smoking status: Former Smoker    Quit date: 05/30/2013  . Smokeless tobacco: Never Used     Comment: 2 cigars a year  . Alcohol Use: No     Comment: No alcohol in 1 year   family history includes Hyperlipidemia in his father and other; Hypertension in his other; Stroke in his maternal grandmother. There is no history of Heart attack, Diabetes, or Sudden death.  ROS as above Medications: Current Outpatient Prescriptions  Medication Sig Dispense Refill  . cephALEXin (KEFLEX) 500 MG capsule Take 1 capsule (500 mg total) by mouth 2 (two) times daily. For 7 days 14 capsule 0  . ORACEA 40 MG capsule Take 40 mg by mouth daily.    . sertraline (ZOLOFT) 50 MG tablet Take 1 tablet (50 mg total) by mouth daily. 90 tablet 1  . sulfamethoxazole-trimethoprim (BACTRIM DS,SEPTRA DS) 800-160 MG tablet Take 1 tablet by mouth 2 (two) times daily. 14 tablet 0  . venlafaxine (EFFEXOR) 75 MG tablet TAKE 1 TABLET (75 MG TOTAL) BY MOUTH 2 (TWO) TIMES DAILY. 120 tablet 1   No current  facility-administered medications for this visit.   No Known Allergies   Exam:  BP 107/66 mmHg  Pulse 66  Wt 159 lb (72.122 kg) Gen: Well NAD Right elbow: Small 1 cm erythematous nontender papule with no expressible pus. This is overlying soft nontender fluctuance at the olecranon consistent with noninfected bursitis. Normal elbow motion. Pulses capillary refill and sensation intact distally.  No results found for this or any previous visit (from the past 24 hour(s)). No results found.   Please see individual assessment and plan sections.

## 2015-11-15 NOTE — Assessment & Plan Note (Signed)
Patient has what appears to be traumatic olecranon bursitis with overlying cellulitis. I am doubtful for septic bursitis.  Plan for compressive sleeve.  Will proceed with injection if not improving in the near future.

## 2015-11-15 NOTE — Patient Instructions (Signed)
Thank you for coming in today. Get a medium body helix elbow sleeve.  Continue the antibiotics.  Return in 1 month or so.   Olecranon Bursitis With Rehab A bursa is a fluid-filled sac that is located between soft tissues (ligaments, tendons, skin) and bones. The purpose of a bursa is to allow the soft tissue to function smoothly, without friction. The olecranon bursa is located between the back of the elbow (olecranon) and the skin. Olecranon bursitis involves inflammation of this bursa, resulting in pain. SYMPTOMS   Pain, tenderness, swelling, warmth, or redness over the back of the elbow.  Reduced range of motion of the affected elbow.  Sometimes, severe pain with movement of the affected elbow.  Crackling sound (crepitation) when the bursa is moved or touched.  Often, painless swelling of the bursa.  Fever (when infected). CAUSES  Olecranon bursitis is often caused by direct hit (trauma) to the elbow. Less commonly, it is due to overuse and/or strenuous exercise that the elbow is not used to. RISK INCREASES WITH:  Sports that require bending or landing on the elbow (football, volleyball).  Vigorous or repetitive athletic training, or sudden increase or change in activity level (weekend warriors).  Failure to warm up properly before activity.  Poor exercise technique.  Playing on artificial turf. PREVENTION  Avoid injuries and the overuse of muscles whenever possible.  Warm up and stretch properly before activity.  Allow for adequate recovery between workouts.  Maintain physical fitness:  Strength, flexibility, and endurance.  Cardiovascular fitness.  Learn and use proper technique.  Wear properly fitted and padded protective equipment. PROGNOSIS  If treated properly, olecranon bursitis is usually curable within 2 weeks.  RELATED COMPLICATIONS   Longer healing time, if not properly treated or if not given enough time to heal.  Recurring symptoms that result  in a chronic problem.  Joint stiffness with permanent limitation of the affected joint's movement.  Infection of the bursa.  Chronic inflammation or scarring of the bursa. TREATMENT Treatment first involves the use of ice and medicine to reduce pain and inflammation. The use of strengthening and stretching exercises may help reduce pain with activity. These exercises may be performed at home or with a therapist. Elbow pads may be advised to protect the bursa. If symptoms persist despite nonsurgical treatment, a procedure to withdraw fluid from the bursa may be advised. This procedure may be accompanied with an injection of corticosteroids to reduce inflammation. Sometimes, surgery is needed to remove the bursa. MEDICATION  If pain medicine is needed, nonsteroidal anti-inflammatory medicines (aspirin and ibuprofen), or other minor pain relievers (acetaminophen), are often advised.  Do not take pain medicine for 7 days before surgery.  Prescription pain relievers may be given, if your caregiver thinks they are needed. Use only as directed and only as much as you need.  Corticosteroid injections may be given by your caregiver. These injections should be reserved for the most serious cases, because they may only be given a certain number of times. HEAT AND COLD  Cold treatment (icing) should be applied for 10 to 15 minutes every 2 to 3 hours for inflammation and pain, and immediately after activity that aggravates your symptoms. Use ice packs or an ice massage.  Heat treatment may be used before performing stretching and strengthening activities prescribed by your caregiver, physical therapist, or athletic trainer. Use a heat pack or a warm water soak. SEEK IMMEDIATE MEDICAL CARE IF:   Symptoms get worse or do not improve in  2 weeks, despite treatment.  Signs of infection develop, including fever of 102 F (38.9 C), increased pain, redness, warmth, or pus draining from the bursa.  New,  unexplained symptoms develop. (Drugs used in treatment may produce side effects.) EXERCISES  RANGE OF MOTION (ROM) AND STRETCHING EXERCISES - Olecranon Bursitis These exercises may help you when beginning to rehabilitate your injury. Your symptoms may resolve with or without further involvement from your physician, physical therapist or athletic trainer. While completing these exercises, remember:   Restoring tissue flexibility helps normal motion to return to the joints. This allows healthier, less painful movement and activity.  An effective stretch should be held for at least 30 seconds.  A stretch should never be painful. You should only feel a gentle lengthening or release in the stretched tissue. RANGE OF MOTION - Elbow Flexion, Supine  Lie on your back. Extend your right / left arm into the air, bracing it with your opposite hand. Allow your right / left arm to relax.  Let your elbow bend, allowing your hand to fall slowly toward your chest.  You should feel a gentle stretch along the back of your upper arm and elbow. Your physician, physical therapist or athletic trainer may ask you to hold a __________ hand weight to increase the intensity of this stretch.  Hold for __________ seconds. Slowly return your right / left arm to the upright position. Repeat __________ times. Complete this exercise __________ times per day. STRETCH - Elbow Flexors  Lie on a firm bed or countertop on your back. Be sure that you are in a comfortable position which will allow you to relax your arm muscles.  Place a folded towel under your right / left upper arm, so that your elbow and shoulder are at the same height. Extend your arm; your elbow should not rest on the bed or towel  Allow the weight of your hand to straighten your elbow. Keep your arm and chest muscles relaxed. Your caregiver may ask you to increase the intensity of your stretch by adding a small wrist or hand weight.  Hold for __________  seconds. You should feel a stretch on the inside of your elbow. Slowly return to the starting position. Repeat __________ times. Complete this exercise __________ times per day. STRENGTHENING EXERCISES - Olecranon Bursitis These exercises will help you regain your strength. They may resolve your symptoms with or without further involvement from your physician, physical therapist or athletic trainer. While completing these exercises, remember:   Muscles can gain both the endurance and the strength needed for everyday activities through controlled exercises.  Complete these exercises as instructed by your physician, physical therapist or athletic trainer. Increase the resistance and repetitions only as guided by your caregiver.  You may experience muscle soreness or fatigue, but the pain or discomfort you are trying to eliminate should never worsen during these exercises. If this pain does worsen, stop and make certain you are following the directions exactly. If the pain is still present after adjustments, discontinue the exercise until you can discuss the trouble with your caregiver. STRENGTH - Elbow Extensors, Isometric  Stand or sit upright on a firm surface. Place your right / left arm so that your palm faces your stomach, and it is at the height of your waist.  Place your opposite hand on the underside of your forearm. Gently push up as your right / left arm resists. Push as hard as you can with both arms without causing any pain  or movement at your right / left elbow. Hold this stationary position for __________ seconds.  Gradually release the tension in both arms. Allow your muscles to relax completely before repeating. Repeat __________ times. Complete this exercise __________ times per day. STRENGTH - Elbow Flexors, Isometric  Stand or sit upright on a firm surface. Place your right / left arm so that your hand is palm-up and at the height of your waist.  Place your opposite hand on top  of your forearm. Gently push down as your right / left arm resists. Push as hard as you can with both arms without causing any pain or movement at your right / left elbow. Hold this stationary position for __________ seconds.  Gradually release the tension in both arms. Allow your muscles to relax completely before repeating. Repeat __________ times. Complete this exercise __________ times per day. STRENGTH - Elbow Flexors, Supinated  With good posture, stand or sit on a firm chair without armrests. Allow your right / left arm to rest at your side with your palm facing forward.  Holding a __________ weight, or gripping a rubber exercise band or tubing,  bring your hand toward your shoulder.  Allow your muscles to control the resistance as your hand returns to your side. Repeat __________ times. Complete this exercise __________ times per day.  STRENGTH - Elbow Flexors, Neutral  With good posture, stand or sit on a firm chair without armrests. Allow your right / left arm to rest at your side with your thumb facing forward.  Holding a __________weight, or gripping a rubber exercise band or tubing,  bring your hand toward your shoulder.  Allow your muscles to control the resistance as your hand returns to your side. Repeat __________ times. Complete this exercise __________ times per day.  STRENGTH - Elbow Extensors  Lie on your back. Extend your right / left elbow into the air, pointing it toward the ceiling. Brace your arm with your opposite hand.*  Holding a __________ weight in your hand, slowly straighten your right / left elbow.  Allow your muscles to control the weight as your hand returns to its starting position. Repeat __________ times. Complete this exercise __________ times per day. *You may also stand with your elbow overhead and pointed toward the ceiling, supported by your opposite hand. STRENGTH - Elbow Extensors, Dynamic  With good posture, stand, or sit on a firm  chair without armrests. Keeping your upper arms at your side, bring both hands up to your right / left shoulder while gripping a rubber exercise band or tubing. Your right / left hand should be just below the other hand.  Straighten your right / left elbow. Hold for __________ seconds.  Allow your muscles to control the rubber exercise band, as your hand returns to your shoulder. Repeat __________ times. Complete this exercise __________ times per day.   This information is not intended to replace advice given to you by your health care provider. Make sure you discuss any questions you have with your health care provider.   Document Released: 10/16/2005 Document Revised: 03/02/2015 Document Reviewed: 01/28/2009 Elsevier Interactive Patient Education [employer]

## 2016-01-06 ENCOUNTER — Encounter: Payer: Self-pay | Admitting: Behavioral Health

## 2016-01-06 ENCOUNTER — Telehealth: Payer: Self-pay | Admitting: Behavioral Health

## 2016-01-06 NOTE — Telephone Encounter (Signed)
Pre-Visit Call completed with patient and chart updated.   Pre-Visit Info documented in Specialty Comments under [employer].    

## 2016-01-07 ENCOUNTER — Ambulatory Visit (INDEPENDENT_AMBULATORY_CARE_PROVIDER_SITE_OTHER): Payer: BLUE CROSS/BLUE SHIELD | Admitting: Family

## 2016-01-07 ENCOUNTER — Encounter: Payer: Self-pay | Admitting: Family

## 2016-01-07 VITALS — BP 110/70 | HR 86 | Temp 98.1°F | Resp 16 | Ht 71.0 in | Wt 157.0 lb

## 2016-01-07 DIAGNOSIS — F329 Major depressive disorder, single episode, unspecified: Secondary | ICD-10-CM | POA: Diagnosis not present

## 2016-01-07 DIAGNOSIS — F32A Depression, unspecified: Secondary | ICD-10-CM

## 2016-01-07 DIAGNOSIS — Z0000 Encounter for general adult medical examination without abnormal findings: Secondary | ICD-10-CM | POA: Diagnosis not present

## 2016-01-07 DIAGNOSIS — Z0001 Encounter for general adult medical examination with abnormal findings: Secondary | ICD-10-CM | POA: Diagnosis not present

## 2016-01-07 LAB — CBC WITH DIFFERENTIAL/PLATELET
BASOS ABS: 0 10*3/uL (ref 0.0–0.1)
Basophils Relative: 0.3 % (ref 0.0–3.0)
EOS ABS: 0.1 10*3/uL (ref 0.0–0.7)
Eosinophils Relative: 1.6 % (ref 0.0–5.0)
HEMATOCRIT: 42.8 % (ref 39.0–52.0)
HEMOGLOBIN: 14.7 g/dL (ref 13.0–17.0)
LYMPHS PCT: 32.4 % (ref 12.0–46.0)
Lymphs Abs: 1.9 10*3/uL (ref 0.7–4.0)
MCHC: 34.3 g/dL (ref 30.0–36.0)
MCV: 90 fl (ref 78.0–100.0)
MONOS PCT: 6.4 % (ref 3.0–12.0)
Monocytes Absolute: 0.4 10*3/uL (ref 0.1–1.0)
NEUTROS ABS: 3.5 10*3/uL (ref 1.4–7.7)
Neutrophils Relative %: 59.3 % (ref 43.0–77.0)
PLATELETS: 221 10*3/uL (ref 150.0–400.0)
RBC: 4.75 Mil/uL (ref 4.22–5.81)
RDW: 12.7 % (ref 11.5–15.5)
WBC: 6 10*3/uL (ref 4.0–10.5)

## 2016-01-07 LAB — HEPATIC FUNCTION PANEL
ALBUMIN: 4.6 g/dL (ref 3.5–5.2)
ALK PHOS: 78 U/L (ref 39–117)
ALT: 44 U/L (ref 0–53)
AST: 33 U/L (ref 0–37)
BILIRUBIN DIRECT: 0.1 mg/dL (ref 0.0–0.3)
TOTAL PROTEIN: 7.3 g/dL (ref 6.0–8.3)
Total Bilirubin: 0.5 mg/dL (ref 0.2–1.2)

## 2016-01-07 LAB — LIPID PANEL
CHOL/HDL RATIO: 3
Cholesterol: 168 mg/dL (ref 0–200)
HDL: 48.2 mg/dL (ref >39.00)
LDL Cholesterol: 106 mg/dL — ABNORMAL HIGH (ref 0–99)
NONHDL: 120.12
Triglycerides: 73 mg/dL (ref 0.0–149.0)
VLDL: 14.6 mg/dL (ref 0.0–40.0)

## 2016-01-07 LAB — URINALYSIS, ROUTINE W REFLEX MICROSCOPIC
Bilirubin Urine: NEGATIVE
Hgb urine dipstick: NEGATIVE
KETONES UR: NEGATIVE
LEUKOCYTES UA: NEGATIVE
Nitrite: NEGATIVE
PH: 5.5 (ref 5.0–8.0)
RBC / HPF: NONE SEEN (ref 0–?)
SPECIFIC GRAVITY, URINE: 1.02 (ref 1.000–1.030)
TOTAL PROTEIN, URINE-UPE24: NEGATIVE
UROBILINOGEN UA: 0.2 (ref 0.0–1.0)
Urine Glucose: NEGATIVE
WBC, UA: NONE SEEN (ref 0–?)

## 2016-01-07 LAB — BASIC METABOLIC PANEL
BUN: 19 mg/dL (ref 6–23)
CALCIUM: 9.5 mg/dL (ref 8.4–10.5)
CO2: 30 meq/L (ref 19–32)
CREATININE: 1.08 mg/dL (ref 0.40–1.50)
Chloride: 103 mEq/L (ref 96–112)
GFR: 83.21 mL/min (ref >60.00)
Glucose, Bld: 85 mg/dL (ref 70–99)
Potassium: 3.9 mEq/L (ref 3.5–5.1)
SODIUM: 140 meq/L (ref 135–145)

## 2016-01-07 LAB — TSH: TSH: 2.05 u[IU]/mL (ref 0.35–4.50)

## 2016-01-07 MED ORDER — VENLAFAXINE HCL 100 MG PO TABS: 100.0000 mg | ORAL_TABLET | Freq: Two times a day (BID) | ORAL | Status: DC

## 2016-01-07 NOTE — Patient Instructions (Addendum)
Complete lab work prior to leaving.  Please call [facility] to arrange appointment with a counselor [phone] Increase Effexor from 75mg  twice daily to 100mg  twice daily.  Send me a mychart message in 2 weeks to let me know how you are feeling with this dose change.

## 2016-01-07 NOTE — Progress Notes (Signed)
Subjective:    Patient ID: Manuel Patton, male    DOB: 1981-09-12, 35 y.o.   MRN: [id number]  HPI   Patient presents today for complete physical.  Immunizations: tetanus up to date  Diet: healthy Exercise: GYM 6 days a week Vision: 10/16 Dental:  Will go next month  Depression- feels like his mood is not as good as it used to be.  Reports that he has  trouble sleeping, trouble getting, up, losing interest in things.  Denies SI/HI.  Reports that the only thing that has changed is that he bought a new house a few months back.  His previous house he shared with his girlfriend who diet in MVA in 2012.   Review of Systems  Constitutional: Negative for unexpected weight change.  HENT: Negative for hearing loss and rhinorrhea.   Eyes: Negative for visual disturbance.  Respiratory: Negative for cough and shortness of breath.   Cardiovascular: Negative for chest pain and leg swelling.  Gastrointestinal: Negative for nausea, vomiting, diarrhea and constipation.  Genitourinary: Negative for dysuria and frequency.  Musculoskeletal: Negative for myalgias and arthralgias.  Skin: Negative for rash.  Neurological: Negative for headaches.  Hematological: Negative for adenopathy.  Psychiatric/Behavioral:       See HPI   Past Medical History  Diagnosis Date  . History of chicken pox   . Lactose intolerance     Social History   Social History  . Marital Status: Single    Spouse Name: N/A  . Number of Children: N/A  . Years of Education: N/A   Occupational History  . Not on file.   Social History Main Topics  . Smoking status: Former Smoker    Quit date: 05/30/2013  . Smokeless tobacco: Never Used     Comment: 2 cigars a year  . Alcohol Use: No     Comment: No alcohol in 1 year  . Drug Use: No  . Sexual Activity: Not on file   Other Topics Concern  . Not on file   Social History Narrative   Regular exercise:  Roller blade/skating 2-3 x weekly   Caffeine use:   Occasional tea.   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 Tonsillectomy and adenoidectomy  2001  . Wisdom tooth extraction      Family History  Problem Relation Age of Onset  . Stroke Maternal Grandmother   . Hypertension Other   . Hyperlipidemia Other     Only knows that htn and hyperlipidemia run  on his dad's side  . Hyperlipidemia Father   . Heart attack Neg Hx   . Diabetes Neg Hx   . Sudden death Neg Hx     No Known Allergies  Current Outpatient Prescriptions on File Prior to Visit  Medication Sig Dispense Refill  . ORACEA 40 MG capsule Take 40 mg by mouth daily.    . sertraline (ZOLOFT) 50 MG tablet Take 1 tablet (50 mg total) by mouth daily. 90 tablet 1   No current facility-administered medications on file prior to visit.    BP 110/70 mmHg  Pulse 86  Temp(Src) 98.1 F (36.7 C) (Oral)  Resp 16  Ht 5\' 11"  (1.803 m)  Wt 157 lb (71.215 kg)  BMI 21.91 kg/m2  SpO2 98%       Objective:   Physical Exam  Physical Exam  Constitutional: He is oriented to person, place, and time. He appears well-developed and well-nourished. No distress.  HENT:  Head: Normocephalic and atraumatic.  Right Ear: Tympanic membrane and ear canal normal.  Left Ear: Tympanic membrane and ear canal normal.  Mouth/Throat: Oropharynx is clear and moist.  Eyes: Pupils are equal, round, and reactive to light. No scleral icterus.  Neck: Normal range of motion. No thyromegaly present.  Cardiovascular: Normal rate and regular rhythm.   No murmur heard. Pulmonary/Chest: Effort normal and breath sounds normal. No respiratory distress. He has no wheezes. He has no rales. He exhibits no tenderness.  Abdominal: Soft. Bowel sounds are normal. He exhibits no distension and no mass. There is no tenderness. There is no rebound and no guarding.  Musculoskeletal: He  exhibits no edema.  Lymphadenopathy:    He has no cervical adenopathy.  Neurological: He is alert and oriented to person, place, and time. He has 3+ patellar reflexes. He exhibits normal muscle tone. Coordination normal.  Skin: Skin is warm and dry.  Psychiatric: He has a slightly flat mood and affect. His behavior is normal. Judgment and thought content normal.          Assessment & Plan:         Assessment & Plan:

## 2016-01-07 NOTE — Assessment & Plan Note (Signed)
Uncontrolled. Increase effexor, continue zoloft. Advised pt to call if symptoms worsen.

## 2016-01-07 NOTE — Progress Notes (Signed)
Pre visit review using our clinic review tool, if applicable. No additional management support is needed unless otherwise documented below in the visit note. 

## 2016-01-07 NOTE — Assessment & Plan Note (Signed)
Discussed continuing healthy diet, exercise.  Immunizations reviewed and up to date.

## 2016-02-02 ENCOUNTER — Ambulatory Visit (INDEPENDENT_AMBULATORY_CARE_PROVIDER_SITE_OTHER): Payer: BLUE CROSS/BLUE SHIELD | Admitting: Family

## 2016-02-02 ENCOUNTER — Encounter: Payer: Self-pay | Admitting: Family

## 2016-02-02 VITALS — BP 120/73 | HR 75 | Temp 98.1°F | Resp 18 | Ht 71.0 in | Wt 156.4 lb

## 2016-02-02 DIAGNOSIS — F32A Depression, unspecified: Secondary | ICD-10-CM

## 2016-02-02 DIAGNOSIS — F329 Major depressive disorder, single episode, unspecified: Secondary | ICD-10-CM | POA: Diagnosis not present

## 2016-02-02 MED ORDER — SERTRALINE HCL 100 MG PO TABS: 100.0000 mg | ORAL_TABLET | Freq: Every day | ORAL | Status: DC

## 2016-02-02 NOTE — Assessment & Plan Note (Signed)
Improved but not yet back to baseline.  We discussed re-establishing with a therapist and will increase zoloft to 100mg  once daily. Continue current dose of effexor.  Follow up in 1 month. 15 min spent with pt >50% of that time was spent counseling patient on depression.

## 2016-02-02 NOTE — Patient Instructions (Signed)
Please call [facility] to schedule an appointment with a therapist [place]. [phone] Increase zoloft to 100mg  once daily. Follow up in 1 month.

## 2016-02-02 NOTE — Progress Notes (Signed)
Pre visit review using our clinic review tool, if applicable. No additional management support is needed unless otherwise documented below in the visit note. 

## 2016-02-02 NOTE — Progress Notes (Signed)
   Subjective:    Patient ID: Manuel Patton, male    DOB: [birth year], 35 y.o.   MRN: [name]  HPI  [name] is a 35 yr old male who presents today for follow up of depression.  Last visit he felt like his mood was not as good as it had been. Reported trouble sleeping, trouble getting, up, losing interest in things. He was continued on zoloft and effexor was increased.  He reports that overall mood is improved but not quite back to his baseline. He is looking forward to visiting family in [place] in a few weeks.  He continues to feel tired during the day due to very restless sleep.   Review of Systems See HPI  Past Medical History  Diagnosis Date  . History of chicken pox   . Lactose intolerance     Social History   Social History  . Marital Status: Single    Spouse Name: N/A  . Number of Children: N/A  . Years of Education: N/A   Occupational History  . Not on file.   Social History Main Topics  . Smoking status: Former Smoker    Quit date: 05/30/2013  . Smokeless tobacco: Never Used     Comment: 2 cigars a year  . Alcohol Use: No     Comment: No alcohol in 1 year  . Drug Use: No  . Sexual Activity: Not on file   Other Topics Concern  . Not on file   Social History Narrative   Regular exercise:  Roller blade/skating 2-3 x weekly   Caffeine use:  Occasional tea.   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 Tonsillectomy and adenoidectomy  2001  . Wisdom tooth extraction      Family History  Problem Relation Age of Onset  . Stroke Maternal Grandmother   . Hypertension Other   . Hyperlipidemia Other     Only knows that htn and hyperlipidemia run  on his dad's side  . Hyperlipidemia Father   . Heart attack Neg Hx   . Diabetes Neg Hx   . Sudden death Neg Hx     No Known Allergies  Current Outpatient Prescriptions  on File Prior to Visit  Medication Sig Dispense Refill  . ORACEA 40 MG capsule Take 40 mg by mouth daily.    Marland Kitchen. venlafaxine (EFFEXOR) 100 MG tablet Take 1 tablet (100 mg total) by mouth 2 (two) times daily. 60 tablet 2   No current facility-administered medications on file prior to visit.    BP 120/73 mmHg  Pulse 75  Temp(Src) 98.1 F (36.7 C) (Oral)  Resp 18  Ht 5\' 11"  (1.803 m)  Wt 156 lb 6.4 oz (70.943 kg)  BMI 21.82 kg/m2  SpO2 99%       Objective:   Physical Exam  Constitutional: He appears well-developed and well-nourished. No distress.  Psychiatric: His behavior is normal. Judgment and thought content normal.  Mildly flat affect          Assessment & Plan:

## 2016-04-17 ENCOUNTER — Other Ambulatory Visit: Payer: Self-pay | Admitting: Family

## 2016-04-22 IMAGING — MR MR SHOULDER*L* W/ CM
6 series · 40 of 40 positions shown · IV contrast (agent unspecified)
Comparison: Radiographs 11/30/2014

CLINICAL DATA: Injured shoulder playing [place] several months ago.
Persistent pain and limited range of motion.

EXAM:
MR ARTHROGRAM OF THE left SHOULDER
TECHNIQUE: Multiplanar, multisequence MR imaging of the left shoulder was
performed following the administration of intra-articular contrast.
CONTRAST:  See Injection Documentation.

[Series 3: T1 fat-sat · axial · 4.0mm · 0.47mm/px · z∈[-17,+79]mm · 6 of 23 slices shown (1 of 4)]
[im 1/23]
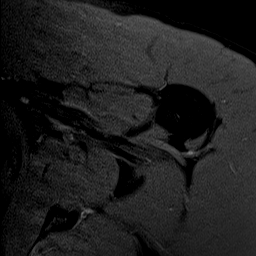
[im 5/23]
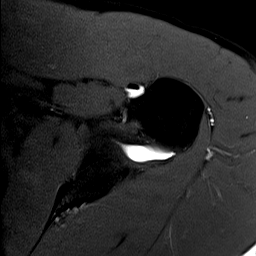
[im 9/23]
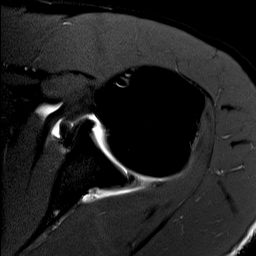
[im 14/23]
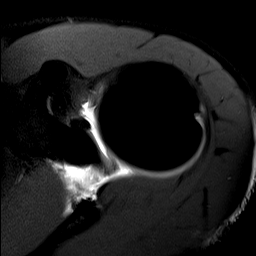
[im 18/23]
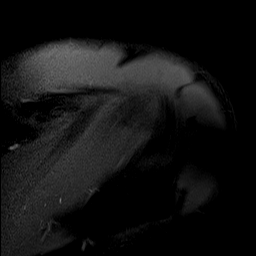
[im 23/23]
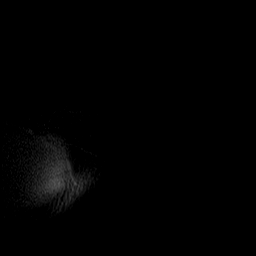

[Series 4: T1 fat-sat · oblique · 4.0mm · 0.55mm/px · 7 of 24 slices shown (2 of 4)]
[im 1/24]
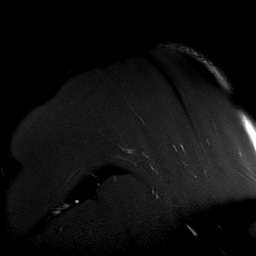
[im 4/24]
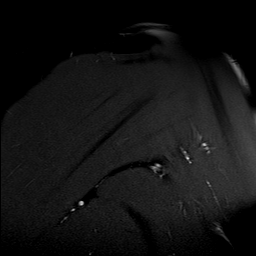
[im 8/24]
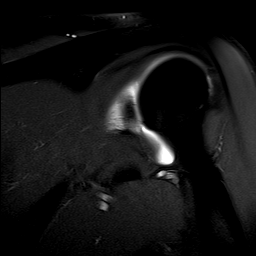
[im 12/24]
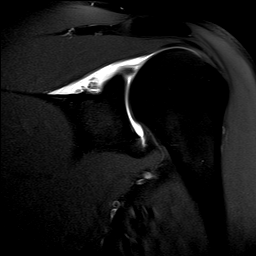
[im 16/24]
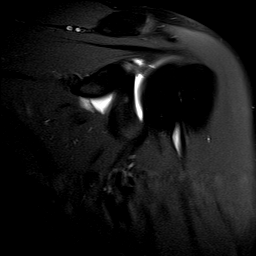
[im 20/24]
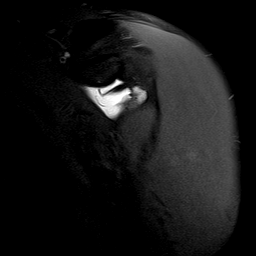
[im 24/24]
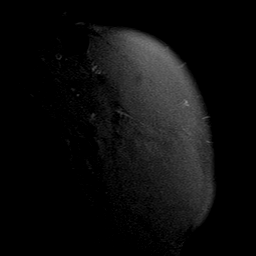

[Series 5: T2 fat-sat · oblique · 4.0mm · 0.55mm/px · 7 of 24 slices shown (1 of 2)]
[im 1/24]
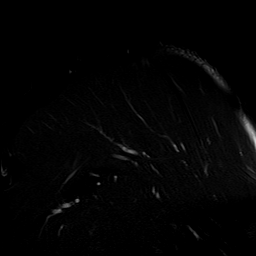
[im 4/24]
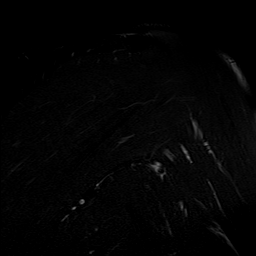
[im 8/24]
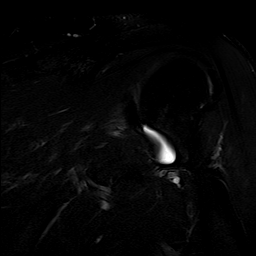
[im 12/24]
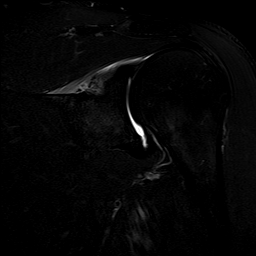
[im 16/24]
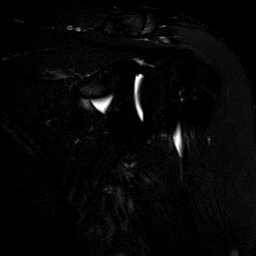
[im 20/24]
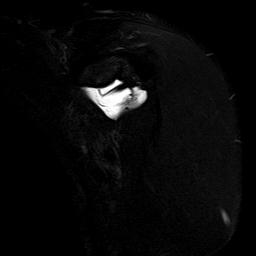
[im 24/24]
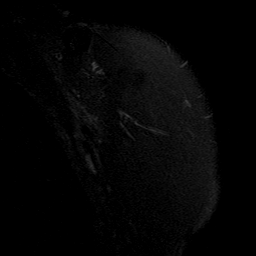

[Series 6: T1 fat-sat · oblique · non-contrast · 4.0mm · 0.55mm/px · 7 of 24 slices shown (3 of 4)]
[im 1/24]
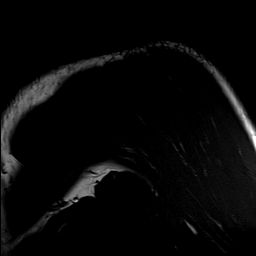
[im 4/24]
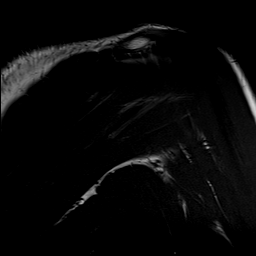
[im 8/24]
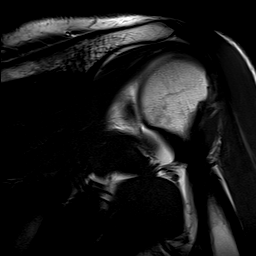
[im 12/24]
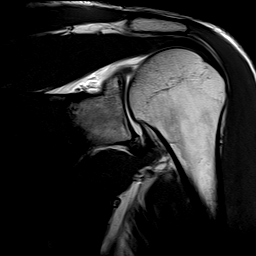
[im 16/24]
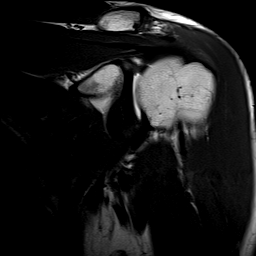
[im 20/24]
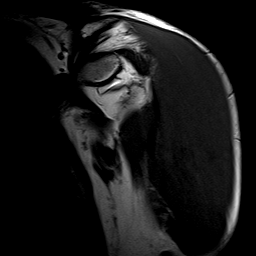
[im 24/24]
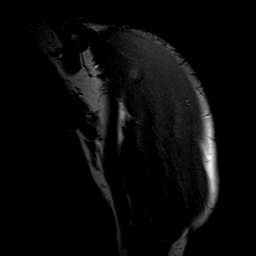

[Series 8: T2 fat-sat · sagittal · 4.0mm · 0.55mm/px · 8 of 26 slices shown (2 of 2)]
[im 1/26]
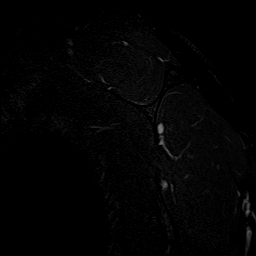
[im 4/26]
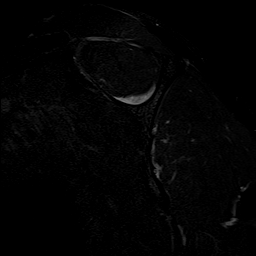
[im 8/26]
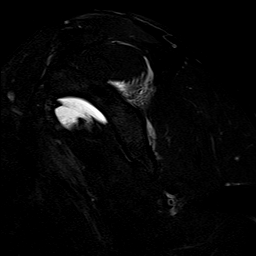
[im 11/26]
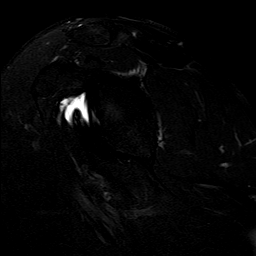
[im 15/26]
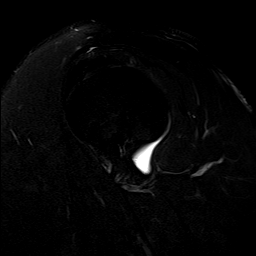
[im 18/26]
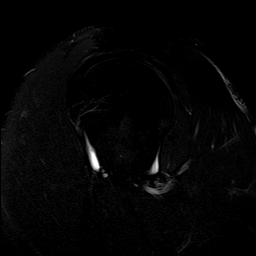
[im 22/26]
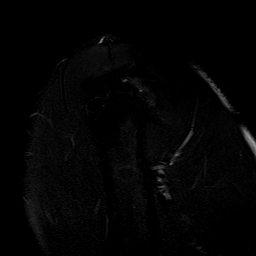
[im 26/26]
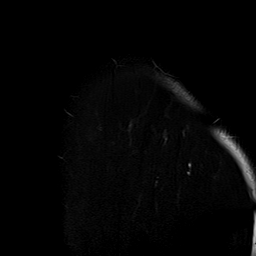

[Series 11: T1 fat-sat · sagittal · 4.0mm · 0.55mm/px · 5 of 17 slices shown (4 of 4)]
[im 1/17]
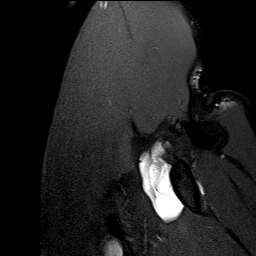
[im 5/17]
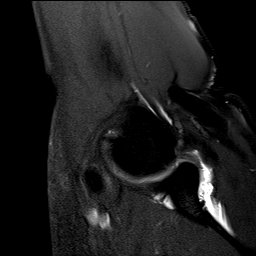
[im 9/17]
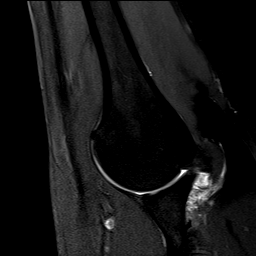
[im 13/17]
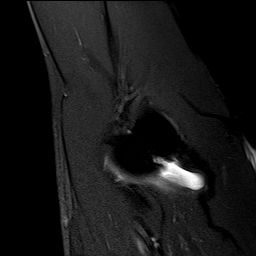
[im 17/17]
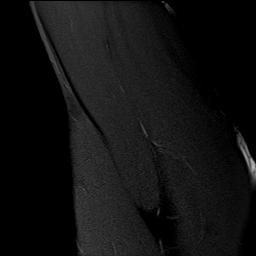

[40 of 40 positions shown; findings below may reference images not displayed]

FINDINGS: Rotator cuff: Mild rotator cuff tendinopathy/ tendinosis. No partial
or full thickness rotator cuff tear.

Muscles: Normal.

Biceps long head: Intact.

Acromioclavicular Joint: Mild degenerative changes. The acromion is
type 1-2 in shape. No lateral downsloping or undersurface spurring.

Glenohumeral Joint: No significant degenerative changes.

Labrum: The superior labrum is intact. Mild degenerative fraying
changes the anterior labrum and anterior labrocapsular complex is
intact. Mild degenerative fraying. There is a posterior labral tear
extending from the 3 o'clock to 5 o'clock position.

Bones: No significant bony abnormalities.
IMPRESSION: 1. Mild rotator cuff tendinopathy/tendinosis with small interstitial
tears but no partial or full thickness tear.
2. Posterior labral tear.
3. Degenerative fraying changes involving the superior and anterior
labrum but no discrete tear. The glenohumeral ligaments are intact
and the long head biceps tendon is intact.
4. No significant findings for bony impingement.

## 2016-05-04 ENCOUNTER — Telehealth: Payer: Self-pay | Admitting: Family

## 2016-05-04 ENCOUNTER — Encounter: Payer: Self-pay | Admitting: Family

## 2016-05-04 ENCOUNTER — Ambulatory Visit (INDEPENDENT_AMBULATORY_CARE_PROVIDER_SITE_OTHER): Payer: BLUE CROSS/BLUE SHIELD | Admitting: Family

## 2016-05-04 VITALS — BP 122/68 | HR 72 | Temp 97.5°F | Ht 71.0 in | Wt 160.2 lb

## 2016-05-04 DIAGNOSIS — F329 Major depressive disorder, single episode, unspecified: Secondary | ICD-10-CM

## 2016-05-04 DIAGNOSIS — R5383 Other fatigue: Secondary | ICD-10-CM | POA: Diagnosis not present

## 2016-05-04 DIAGNOSIS — F32A Depression, unspecified: Secondary | ICD-10-CM

## 2016-05-04 MED ORDER — SERTRALINE HCL 100 MG PO TABS: 100.0000 mg | ORAL_TABLET | Freq: Every day | ORAL | Status: DC

## 2016-05-04 MED ORDER — VENLAFAXINE HCL 100 MG PO TABS: ORAL_TABLET | ORAL | Status: DC

## 2016-05-04 NOTE — Telephone Encounter (Signed)
See my chart message

## 2016-05-04 NOTE — Assessment & Plan Note (Signed)
Improved. Continue current dose of zoloft and effexor.

## 2016-05-04 NOTE — Progress Notes (Signed)
Pre visit review using our clinic review tool, if applicable. No additional management support is needed unless otherwise documented below in the visit note. 

## 2016-05-04 NOTE — Progress Notes (Signed)
   Subjective:    Patient ID: Manuel Patton, male    DOB: 04/18/1981, 35 y.o.   MRN: [id number]  HPI  [name] is a 35 yr old male who presents today for follow up of his depression. Last visit we increased his zoloft to 100mg  once daily. Reports improvement in mood since the zoloft dose was increased. Reports that he sleeps OK, but feels tired all the time.  Thinks that he used to snore, but not recently.  Sleeps 7-9 hours a night.   Review of Systems See HPI  Past Medical History  Diagnosis Date  . History of chicken pox   . Lactose intolerance      Social History   Social History  . Marital Status: Single    Spouse Name: N/A  . Number of Children: N/A  . Years of Education: N/A   Occupational History  . Not on file.   Social History Main Topics  . Smoking status: Former Smoker    Quit date: 05/30/2013  . Smokeless tobacco: Never Used     Comment: 2 cigars a year  . Alcohol Use: No     Comment: No alcohol in 1 year  . Drug Use: No  . Sexual Activity: Not on file   Other Topics Concern  . Not on file   Social History Narrative   Regular exercise:  Roller blade/skating 2-3 x weekly   Caffeine use:  Occasional tea.   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 Tonsillectomy and adenoidectomy  2001  . Wisdom tooth extraction      Family History  Problem Relation Age of Onset  . Stroke Maternal Grandmother   . Hypertension Other   . Hyperlipidemia Other     Only knows that htn and hyperlipidemia run  on his dad's side  . Hyperlipidemia Father   . Heart attack Neg Hx   . Diabetes Neg Hx   . Sudden death Neg Hx     No Known Allergies  Current Outpatient Prescriptions on File Prior to Visit  Medication Sig Dispense Refill  . ORACEA 40 MG capsule Take 40 mg by mouth daily.     No current facility-administered  medications on file prior to visit.    BP 122/68 mmHg  Pulse 72  Temp(Src) 97.5 F (36.4 C) (Oral)  Ht 5\' 11"  (1.803 m)  Wt 160 lb 4 oz (72.689 kg)  BMI 22.36 kg/m2  SpO2 98%       Objective:   Physical Exam  Constitutional: He is oriented to person, place, and time. He appears well-developed and well-nourished. No distress.  Neurological: He is alert and oriented to person, place, and time.  Psychiatric: He has a normal mood and affect. His behavior is normal. Judgment and thought content normal.          Assessment & Plan:  Fatigue- could be side effect of the zoloft.  We discussed possibility of switching zoloft to prozac which is less sedating. We also discussed the possibility of OSA and a home sleep study. Pt wishes to hold off on any further work up or changes for now and will let me know if he changes his mind down the road.  He would like to have his testosterone level checked.

## 2016-05-04 NOTE — Patient Instructions (Signed)
Please continue your current medications. Complete lab work prior to leaving.

## 2016-05-05 LAB — TESTOSTERONE TOTAL,FREE,BIO, MALES
Albumin: 4.5 g/dL (ref 3.6–5.1)
SEX HORMONE BINDING: 63 nmol/L — AB (ref 10–50)
TESTOSTERONE BIOAVAILABLE: 67.9 ng/dL — AB (ref 130.5–681.7)
TESTOSTERONE FREE: 33 pg/mL — AB (ref 47.0–244.0)
Testosterone: 441 ng/dL (ref 250–827)

## 2016-05-06 ENCOUNTER — Encounter: Payer: Self-pay | Admitting: Family

## 2016-05-06 DIAGNOSIS — R7989 Other specified abnormal findings of blood chemistry: Secondary | ICD-10-CM

## 2016-05-06 HISTORY — DX: Other specified abnormal findings of blood chemistry: R79.89

## 2016-05-10 ENCOUNTER — Telehealth: Payer: Self-pay | Admitting: *Deleted

## 2016-05-10 NOTE — Telephone Encounter (Signed)
[facility] and [facility] @ 6:21pm @ [phone]) asking the pt to RTC regarding note below.//AB/CMA

## 2016-05-10 NOTE — Telephone Encounter (Signed)
Please contact pt and let him know that the reason that I referred him to Urology is because his testosterone is low. I sent him a mychart message which he did not read yet. If he is agreeable to proceed after you discuss the result with him, please notify Victorino DikeJennifer.          ----- Message -----     From: Oneal GroutJennifer S Sebastian     Sent: 05/10/2016  1:40 PM      To: Sandford CrazeMelissa O'Sullivan, NP        FYI Patient refused appt with [facility], states he was not aware of referral

## 2016-05-12 ENCOUNTER — Telehealth: Payer: Self-pay | Admitting: Family

## 2016-05-12 NOTE — Telephone Encounter (Signed)
Tried to contact pt to discuss the reason for Urology referral. No answer, left message for pt to return call.

## 2016-05-12 NOTE — Telephone Encounter (Signed)
°  Relationship to patient: Self Can be reached: [phone]   Reason for call: Patient has question about labs and why he was referred to a specialist. Plse adv

## 2016-05-12 NOTE — Telephone Encounter (Signed)
Pt called back and would like to know if the referral for urology is truly necessary for low testosterone. He has an appt set for 6 weeks from now with the urologist but pt would like to discuss first.

## 2016-05-15 ENCOUNTER — Encounter: Payer: Self-pay | Admitting: Family

## 2016-05-15 NOTE — Telephone Encounter (Signed)
I left detailed message on cell re: recommendation for urology referral to address low testosterone. Advised pt to let me know via mychart if he wishes to proceed and I will initiate referral.

## 2016-05-15 NOTE — Telephone Encounter (Signed)
Attempted to reach pt to discuss referral per 05/11/16 phone note and previous mychart message. Left detailed message on pt's cell# that referral is necessary if pt wishes to proceed with treatment of his low testosterone as Urology will need to treat and monitor medication. Advised pt to call me back if he has any further questions, otherwise proceed with referral.

## 2016-05-16 ENCOUNTER — Other Ambulatory Visit: Payer: Self-pay | Admitting: Family

## 2016-05-16 NOTE — Telephone Encounter (Signed)
Received venlaf[facility]ine request from [facility]. Rx just sent to [facility] on 05/04/16. Left message for pt to call and verify request to [facility]. Is pt no longer using mail order or does he only need a 2 week supply until he mail order supply arrives?

## 2016-05-16 NOTE — Telephone Encounter (Signed)
Pt called back stating he is using mail order and not requesting from local pharmacy. Denial sent to [facility].

## 2016-05-17 NOTE — Telephone Encounter (Signed)
Could you please see if we could get pt in sooner to see urology in [place]?  Please fax them a copy of our most recent testosterone labs. Thanks.

## 2016-06-19 ENCOUNTER — Other Ambulatory Visit: Payer: Self-pay | Admitting: Family

## 2016-11-01 ENCOUNTER — Ambulatory Visit (INDEPENDENT_AMBULATORY_CARE_PROVIDER_SITE_OTHER): Payer: BLUE CROSS/BLUE SHIELD | Admitting: Family

## 2016-11-01 ENCOUNTER — Encounter: Payer: Self-pay | Admitting: Family

## 2016-11-01 DIAGNOSIS — F32A Depression, unspecified: Secondary | ICD-10-CM

## 2016-11-01 DIAGNOSIS — F329 Major depressive disorder, single episode, unspecified: Secondary | ICD-10-CM

## 2016-11-01 MED ORDER — VENLAFAXINE HCL 100 MG PO TABS: ORAL_TABLET | ORAL | 1 refills | Status: DC

## 2016-11-01 MED ORDER — SERTRALINE HCL 100 MG PO TABS: 100.0000 mg | ORAL_TABLET | Freq: Every day | ORAL | 1 refills | Status: DC

## 2016-11-01 NOTE — Patient Instructions (Addendum)
Please contact psychiatry to schedule an appointment. Contact [facility] to schedule an appointment with a counselor.  [phone] Call if your depression symptoms worsen in the meantime while you wait to get in to see psychiatry.   Psychiatric Services:   [facility], [address], [place], [place]   [phone] [facility] [phone] [facility], [address] [place], [place] [phone] [facility], [address], [place], [place] [phone]

## 2016-11-01 NOTE — Progress Notes (Signed)
Pre visit review using our clinic review tool, if applicable. No additional management support is needed unless otherwise documented below in the visit note. 

## 2016-11-01 NOTE — Progress Notes (Signed)
Subjective:    Patient ID: Manuel Patton, male    DOB: [birth year], 36 y.o.   MRN: [id number]  HPI   Manuel Patton is a 36 yr old male who presents today for follow up.  1) depression- pt is maintained on zoloft and effextor. 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  He reports that when he is at the Gym his mood is good and he generally enjoys himself.  He denies SI.   Review of Systems See HPI  Past Medical History:  Diagnosis Date  . History of chicken pox   . Lactose intolerance   . 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 Marital status: Single    Spouse name: N/A  . Number of children: N/A  . Years of education: N/A   Occupational History  . Not on file.   Social History Main Topics  . Smoking status: Former Smoker    Quit date: 05/30/2013  . Smokeless tobacco: Never Used     Comment: 2 cigars a year  . Alcohol use No     Comment: No alcohol in 1 year  . Drug use: No  . Sexual activity: Not on file   Other Topics Concern  . Not on file   Social History Narrative   Regular exercise:  Roller blade/skating 2-3 x weekly   Caffeine use:  Occasional tea.   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 TONSILLECTOMY AND ADENOIDECTOMY  2001  . WISDOM TOOTH EXTRACTION      Family History  Problem Relation Age of Onset  . Stroke Maternal Grandmother   . Hypertension Other   . Hyperlipidemia Other     Only knows that htn and hyperlipidemia run  on his dad's side  . Hyperlipidemia Father   . Heart attack Neg Hx   . Diabetes Neg Hx   . Sudden death Neg Hx     No Known Allergies  Current Outpatient Prescriptions on File Prior to Visit  Medication Sig Dispense Refill  .  ORACEA 40 MG capsule Take 40 mg by mouth daily.    . sertraline (ZOLOFT) 100 MG tablet Take 1 tablet (100 mg total) by mouth daily. 90 tablet 1  . venlafaxine (EFFEXOR) 100 MG tablet TAKE 1 TABLET (100 MG TOTAL) BY MOUTH 2 (TWO) TIMES DAILY. 180 tablet 1   No current facility-administered medications on file prior to visit.     BP 104/68 (BP Location: Left Arm, Cuff Size: Normal)   Pulse 68   Temp 97.9 F (36.6 C) (Oral)   Resp 16   Ht 5\' 11"  (1.803 m)   Wt 161 lb 3.2 oz (73.1 kg)   SpO2 98% Comment: room air  BMI 22.48 kg/m       Objective:   Physical Exam  Constitutional: He is oriented to person, place, and time. He appears well-developed and well-nourished. No distress.  Neurological: He is alert and oriented to person, place, and time.  Psychiatric: His behavior is normal. Judgment and thought content normal.  Flat affect          Assessment & Plan:

## 2016-11-01 NOTE — Assessment & Plan Note (Signed)
Uncontrolled. Scored 10 on PHQ-9.  I have advised him to schedule with psychiatry and a therapist and to let me know if his mood worsens while he waits to get in with psychiatry.  15 min spent with pt today.  >50% of this time was spent counseling patient on his depression.

## 2017-05-29 ENCOUNTER — Other Ambulatory Visit: Payer: Self-pay | Admitting: Family

## 2020-10-25 ENCOUNTER — Ambulatory Visit: Payer: No Typology Code available for payment source | Admitting: Family Medicine

## 2020-10-25 ENCOUNTER — Other Ambulatory Visit: Payer: Self-pay

## 2020-10-25 ENCOUNTER — Encounter: Payer: Self-pay | Admitting: Family Medicine

## 2020-10-25 VITALS — BP 120/80 | HR 67 | Temp 98.0°F | Resp 18 | Ht 71.0 in | Wt 150.6 lb

## 2020-10-25 DIAGNOSIS — G259 Extrapyramidal and movement disorder, unspecified: Secondary | ICD-10-CM | POA: Diagnosis not present

## 2020-10-25 DIAGNOSIS — F32A Depression, unspecified: Secondary | ICD-10-CM | POA: Diagnosis not present

## 2020-10-25 DIAGNOSIS — L719 Rosacea, unspecified: Secondary | ICD-10-CM

## 2020-10-25 DIAGNOSIS — L649 Androgenic alopecia, unspecified: Secondary | ICD-10-CM

## 2020-10-25 DIAGNOSIS — E785 Hyperlipidemia, unspecified: Secondary | ICD-10-CM | POA: Diagnosis not present

## 2020-10-25 LAB — COMPREHENSIVE METABOLIC PANEL
ALT: 20 U/L (ref 0–53)
AST: 21 U/L (ref 0–37)
Albumin: 4.6 g/dL (ref 3.5–5.2)
Alkaline Phosphatase: 57 U/L (ref 39–117)
BUN: 7 mg/dL (ref 6–23)
CO2: 28 mEq/L (ref 19–32)
Calcium: 9.4 mg/dL (ref 8.4–10.5)
Chloride: 104 mEq/L (ref 96–112)
Creatinine, Ser: 0.97 mg/dL (ref 0.40–1.50)
GFR: 98.86 mL/min (ref >60.00)
Glucose, Bld: 96 mg/dL (ref 70–99)
Potassium: 3.9 mEq/L (ref 3.5–5.1)
Sodium: 139 mEq/L (ref 135–145)
Total Bilirubin: 0.8 mg/dL (ref 0.2–1.2)
Total Protein: 7.2 g/dL (ref 6.0–8.3)

## 2020-10-25 LAB — CBC
HCT: 43.7 % (ref 39.0–52.0)
Hemoglobin: 15 g/dL (ref 13.0–17.0)
MCHC: 34.4 g/dL (ref 30.0–36.0)
MCV: 90.4 fl (ref 78.0–100.0)
Platelets: 191 10*3/uL (ref 150.0–400.0)
RBC: 4.84 Mil/uL (ref 4.22–5.81)
RDW: 12.7 % (ref 11.5–15.5)
WBC: 4.7 10*3/uL (ref 4.0–10.5)

## 2020-10-25 LAB — IBC + FERRITIN
Ferritin: 154.8 ng/mL (ref 22.0–322.0)
Iron: 208 ug/dL — ABNORMAL HIGH (ref 42–165)
Saturation Ratios: 69.8 % — ABNORMAL HIGH (ref 20.0–50.0)
Transferrin: 213 mg/dL (ref 212.0–360.0)

## 2020-10-25 LAB — LIPID PANEL
Cholesterol: 174 mg/dL (ref 0–200)
HDL: 49.5 mg/dL (ref >39.00)
LDL Cholesterol: 105 mg/dL — ABNORMAL HIGH (ref 0–99)
NonHDL: 124.97
Total CHOL/HDL Ratio: 4
Triglycerides: 100 mg/dL (ref 0.0–149.0)
VLDL: 20 mg/dL (ref 0.0–40.0)

## 2020-10-25 LAB — TSH: TSH: 1.79 u[IU]/mL (ref 0.35–4.50)

## 2020-10-25 LAB — VITAMIN B12: Vitamin B-12: 247 pg/mL (ref 211–911)

## 2020-10-25 NOTE — Patient Instructions (Signed)
It was very nice to see you today!  We will check blood work today.  We can have you come back to get a chest x-ray.  I may send you to see a neurologist depending on the results of your blood work.  Take care, Dr Jimmey Ralph  Please try these tips to maintain a healthy lifestyle:   Eat at least 3 REAL meals and 1-2 snacks per day.  Aim for no more than 5 hours between eating.  If you eat breakfast, please do so within one hour of getting up.    Each meal should contain half fruits/vegetables, one quarter protein, and one quarter carbs (no bigger than a computer mouse)   Cut down on sweet beverages. This includes juice, soda, and sweet tea.     Drink at least 1 glass of water with each meal and aim for at least 8 glasses per day   Exercise at least 150 minutes every week.

## 2020-10-25 NOTE — Assessment & Plan Note (Signed)
Discussed treatment options.  He is concerned about low testosterone - will avoid finasteride for now.  Recommended minoxidil.

## 2020-10-25 NOTE — Assessment & Plan Note (Signed)
Not currently on any medications.  Mood is overall stable.  He is following with a therapist.

## 2020-10-25 NOTE — Assessment & Plan Note (Signed)
On doxycycline 40 mg daily per dermatology.

## 2020-10-25 NOTE — Assessment & Plan Note (Signed)
Check lipids.

## 2020-10-25 NOTE — Progress Notes (Signed)
   Manuel Patton is a 39 y.o. male who presents today for an office visit.  He is a new patient  Assessment/Plan:  New/Acute Problems: Wheezing Normal exam today.  Not currently have any symptoms.  No red flags.  Will need to get a chest x-ray but he would like to defer for today.  Will check labs.  Depending on results of chest x-ray may need referral to pulmonology for PFTs.  Involuntary Movements Normal exam.  Will check labs including CBC, CMET, TSH, iron panel, and B12.  May need neuro referral depending on results.  Has no history of medication use that could cause tardive dyskinesia.  Chronic Problems Addressed Today: Rosacea On doxycycline 40 mg daily per dermatology.  Hyperlipidemia Check lipids.  Depression Not currently on any medications.  Mood is overall stable.  He is following with a therapist.  Androgenetic alopecia Discussed treatment options.  He is concerned about low testosterone - will avoid finasteride for now.  Recommended minoxidil.     Subjective:  HPI:  Patient is here to establish care.  He was last seen in the system about 3 years ago.  He has 2 concerns he would like to discuss today.  He has had more difficulty with feeling short of breath for the last 3 months or so.  No obvious precipitating events.  Says that it occurred suddenly.  Does not have any symptoms at rest but will occasionally feel like he cannot daily breath.  No recent illnesses.  No fever chills.  Does not think that has had Covid.  Sometimes worse after exercise.  Some wheezing.  No fevers or chills.  Is also concerned about involuntary facial movements.  This has been going on for the past 34 years.  He has a sensation like he has to stretch or move his tongue or jaw.  Sometimes has involuntary movements.  Symptoms worsen with anxiety.  He does have a history of depression has been on Zoloft and Effexor in the past but has not been on any antipsychotics.  Has never been on  Reglan in the past either.       Objective:  Physical Exam: BP 120/80   Pulse 67   Temp 98 F (36.7 C) (Temporal)   Resp 18   Ht 5\' 11"  (1.803 m)   Wt 150 lb 9.6 oz (68.3 kg)   SpO2 98%   BMI 21.00 kg/m   Gen: No acute distress, resting comfortably CV: Regular rate and rhythm with no murmurs appreciated Pulm: Normal work of breathing, clear to auscultation bilaterally with no crackles, wheezes, or rhonchi Neuro: No apparent involuntary movements.  Cranial nerves II through XII intact.  Finger-nose-finger testing intact bilaterally.  Strength 5 out of 5 in upper and lower extremities.  Sensation to light touch intact throughout.  Reflexes 2+ and symmetric bilaterally. Psych: Normal affect and thought content      Caz Weaver M. , MD 10/25/2020 9:51 AM

## 2020-10-26 ENCOUNTER — Other Ambulatory Visit: Payer: Self-pay | Admitting: *Deleted

## 2020-10-26 DIAGNOSIS — G259 Extrapyramidal and movement disorder, unspecified: Secondary | ICD-10-CM

## 2020-10-26 NOTE — Progress Notes (Signed)
Please inform patient of the following:  Cholesterol mildly elevated but all of his other labs are NORMAL. No clear explanation for his movement issue either. Recommend referral to neurology for further evaluation if he is interested.  Katina Degree. Jimmey Ralph, MD 10/26/2020 12:52 PM

## 2020-11-04 ENCOUNTER — Other Ambulatory Visit: Payer: No Typology Code available for payment source

## 2020-11-04 ENCOUNTER — Telehealth: Payer: Self-pay

## 2020-11-04 ENCOUNTER — Ambulatory Visit (INDEPENDENT_AMBULATORY_CARE_PROVIDER_SITE_OTHER): Payer: No Typology Code available for payment source

## 2020-11-04 ENCOUNTER — Other Ambulatory Visit: Payer: Self-pay | Admitting: *Deleted

## 2020-11-04 ENCOUNTER — Other Ambulatory Visit: Payer: Self-pay

## 2020-11-04 DIAGNOSIS — R0602 Shortness of breath: Secondary | ICD-10-CM

## 2020-11-04 NOTE — Telephone Encounter (Signed)
Chest Xray placed

## 2020-11-04 NOTE — Progress Notes (Signed)
Assessment/Plan:   1.  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  The OCD started while very young with the mild tics starting before the age of 15 (blinking).  This has become more bothersome to him and he has isolated himself somewhat because of it.  We talked about various treatments and the reasons why or why not patients choose to take them.  We talked about clonidine and its risks and benefits.  We talked about antipsychotic medications and their risks and benefits.  Ultimately, clonidine was decided upon, but talked extensively about the fact it could lower blood pressure.  His is already very low.  He will keep an eye on that.  We will just start with 0.1 mg daily.  He will let me know if he feels lightheaded/dizzy/near syncopal.  R/B/SE were discussed.  The opportunity to ask questions was given and they were answered to the best of my ability.  The patient expressed understanding and willingness to follow the outlined treatment protocols.  -I am not sure, but do wonder if taking him off of the sertraline/Effexor just prior to the pandemic may have worsened his symptoms somewhat.  I think that the isolation he faced with the pandemic may have worsened symptoms, as anxiety will often worsen tics.  Nonetheless, these are also considerations for the future.  2. B12 deficiency  -pt with evidence of B12 deficiency.  Discussed with the patient that neurologically, would like to see B12 levels greater than 400.  Would recommend oral B12 supplementation, 1000 mcg daily.  Subjective:   Manuel Patton was seen today in neurologic consultation at the request of Ardith Dark, MD.  The consultation is for the evaluation of "involuntary facial movements for 34 years."  Patient has a sensation that he needs to stretch or move the tongue or jaw, sometimes accompanied by actual  involuntary movements. Pt states that he thinks today that it started when he was 38-91 years old.  He remembers he would have to count to 5-10 before closing a car door or something similar.  He would have to touch something 3 times before moving on to another task.  As he got older, that stayed about the same but he noted movements in the early 20's.  He noted blinking of the eyes.  As time has gone on, he has noted, he gets a feeling that he needs to stretch his neck a certain way.  He has a feeling like the eyes need to open wide.  He remembers in the teen years having to look at things before being able to move on.  He has learned how to disguise some of the movements with the purposeful movements.  He has no vocalizations.  Stress/anxiety will trigger it and make it worse.  He has been on sertraline and effexor in the past but has been off for 3-4 years.  He doesn't know if it changed it one way or another.  The pandemic may have made things worse (was at home alone and then when went out sx's were worse).    Patient has never been on antipsychotic medications.  Patient has never been exposed to metoclopramide.     Neuroimaging has not previously been performed.     ALLERGIES:   Allergies  Allergen Reactions  . Aspirin     CURRENT MEDICATIONS:  Outpatient Encounter Medications as of 11/09/2020  Medication Sig  . ORACEA 40 MG capsule Take 40 mg by mouth daily.   No facility-administered encounter medications on file as of 11/09/2020.    Objective:   PHYSICAL EXAMINATION:    VITALS:   Vitals:   11/09/20 0838  BP: 102/64  Pulse: 63  SpO2: 98%  Weight: 151 lb (68.5 kg)  Height: 5\' 11"  (1.803 m)    GEN:  Normal appears male in no acute distress.  Appears stated age. HEENT:  Normocephalic, atraumatic. The mucous membranes are moist. The superficial temporal arteries are without ropiness or tenderness. Cardiovascular: Regular rate and rhythm. Lungs: Clear to auscultation  bilaterally. Neck/Heme: There are no carotid bruits noted bilaterally.  NEUROLOGICAL: Orientation:  The patient is alert and oriented x 3.   Cranial nerves: There is good facial symmetry.  Extraocular muscles are intact and visual fields are full to confrontational testing. Speech is fluent and clear. Soft palate rises symmetrically and there is no tongue deviation. Hearing is intact to conversational tone. Tone: Tone is good throughout. Sensation: Sensation is intact to light touch and pinprick throughout (facial, trunk, extremities). Vibration is intact at the bilateral big toe. There is no extinction with double simultaneous stimulation. There is no sensory dermatomal level identified. Coordination:  The patient has no difficulty with RAM's or FNF bilaterally. Motor: Strength is 5/5 in the bilateral upper and lower extremities.  Shoulder shrug is equal and symmetric. There is no pronator drift.  There are no fasciculations noted. DTR's: Deep tendon reflexes are 3/4 at the bilateral biceps, triceps, brachioradialis, patella and achilles.  Plantar responses are downgoing bilaterally. Gait and Station: The patient is able to ambulate without difficulty. The patient is able to heel toe walk without any difficulty. The patient is able to ambulate in a tandem fashion. The patient is able to stand in the Romberg position. Abnormal movements: The patient does have some movements of the mouth (just would pull the lips together, as if blotting chapstick).  No tongue movements.  Tongue does not protrude outside of the mouth.  I have reviewed and interpreted the following labs independently Lab Results  Component Value Date   VITAMINB12 247 10/25/2020   Lab Results  Component Value Date   TSH 1.79 10/25/2020     Total time spent on today's visit was 45 minutes, including both face-to-face time and nonface-to-face time.  Time included that spent on review of records (prior notes available to  me/labs/imaging if pertinent), discussing treatment and goals, answering patient's questions and coordinating care.   Cc:  10/27/2020, MD

## 2020-11-04 NOTE — Progress Notes (Signed)
Chest  

## 2020-11-04 NOTE — Telephone Encounter (Signed)
Patient called in asking to be scheduled for an x-ray but did not see any orders, is it okay to place patient on lab schedule for an x-ray?

## 2020-11-04 NOTE — Telephone Encounter (Signed)
Patient is scheduled for this afternoon.

## 2020-11-08 NOTE — Progress Notes (Signed)
Please inform patient of the following:  Xray is normal. Would like for him to let us know if symptoms are persisting and we can refer him to see a lung doctor.  Katina Degree. Jimmey Ralph, MD 11/08/2020 9:36 AM

## 2020-11-09 ENCOUNTER — Other Ambulatory Visit: Payer: Self-pay

## 2020-11-09 ENCOUNTER — Ambulatory Visit: Payer: No Typology Code available for payment source | Admitting: Neurology

## 2020-11-09 ENCOUNTER — Encounter: Payer: Self-pay | Admitting: Neurology

## 2020-11-09 VITALS — BP 102/64 | HR 63 | Ht 71.0 in | Wt 151.0 lb

## 2020-11-09 DIAGNOSIS — E538 Deficiency of other specified B group vitamins: Secondary | ICD-10-CM | POA: Diagnosis not present

## 2020-11-09 DIAGNOSIS — F952 Tourette's disorder: Secondary | ICD-10-CM

## 2020-11-09 MED ORDER — CLONIDINE HCL 0.1 MG PO TABS: 0.1000 mg | ORAL_TABLET | Freq: Every day | ORAL | 1 refills | Status: DC

## 2020-11-09 NOTE — Patient Instructions (Signed)
1.  Start oral B12 supplementation, 1000 mcg daily. 2.  Start clonidine, 0.1 mg daily.  Watch your blood pressure.  If you feel lightheaded or dizzy, let us know.  Drink plenty of water!  The physicians and staff at [facility] are committed to providing excellent care. You may receive a survey requesting feedback about your experience at our office. We strive to receive "very good" responses to the survey questions. If you feel that your experience would prevent you from giving the office a "very good " response, please contact our office to try to remedy the situation. We may be reached at [phone]. Thank you for taking the time out of your busy day to complete the survey.

## 2021-01-04 ENCOUNTER — Other Ambulatory Visit: Payer: Self-pay | Admitting: *Deleted

## 2021-01-04 ENCOUNTER — Telehealth: Payer: Self-pay

## 2021-01-04 DIAGNOSIS — R0602 Shortness of breath: Secondary | ICD-10-CM

## 2021-01-04 NOTE — Telephone Encounter (Signed)
Please advise 

## 2021-01-04 NOTE — Telephone Encounter (Signed)
Ok with referral.  Katina Degree. Jimmey Ralph, MD 01/04/2021 3:34 PM

## 2021-01-04 NOTE — Telephone Encounter (Signed)
Referral placed.

## 2021-01-04 NOTE — Telephone Encounter (Signed)
Patient called in and said he would like to have the referral for  pulmonology sent in said he doesn't feel like he's getting any better from his last appt.

## 2021-03-29 NOTE — Progress Notes (Signed)
   Assessment/Plan:   1.  Tics  -doing well on clonidine, 0.1 mg daily.  Will continue on this.  R/B/SE were discussed.  The opportunity to ask questions was given and they were answered to the best of my ability.  The patient expressed understanding and willingness to follow the outlined treatment protocols.  -I refilled med x 9 months.  As long as patient doing well, he can just f/u with PCP (due to specialist copay vs PCP copay).  Happy to see him back if issues arise.   Subjective:   Manuel Patton was seen today in follow up for tics.  My previous records as well as any outside records available were reviewed prior to todays visit.  Pt is currently on clonidine.  States that being on the med, he is back to his prior "baseline" before the "explosion" over the last few years when sx's increased.  No trouble exercising.  No fatigue if takes med at night.  Current med: Clonidine: 0.1 mg daily    CURRENT MEDICATIONS:  Outpatient Encounter Medications as of 03/31/2021  Medication Sig  . cloNIDine (CATAPRES) 0.1 MG tablet Take 1 tablet (0.1 mg total) by mouth daily.  . ORACEA 40 MG capsule Take 40 mg by mouth daily.   No facility-administered encounter medications on file as of 03/31/2021.     Objective:   PHYSICAL EXAMINATION:    VITALS:   Vitals:   03/31/21 1044  BP: 124/74  Pulse: 61  SpO2: 98%  Weight: 150 lb (68 kg)  Height: 5\' 11"  (1.803 m)    GEN:  The patient appears stated age and is in NAD. HEENT:  Normocephalic, atraumatic.  The mucous membranes are moist. The superficial temporal arteries are without ropiness or tenderness. CV:  RRR Lungs:  CTAB Neck/HEME:  There are no carotid bruits bilaterally.  Neurological examination:  Orientation: The patient is alert and oriented x3. Cranial nerves: There is good facial symmetry.The speech is fluent and clear. Soft palate rises symmetrically and there is no tongue deviation. Hearing is intact to conversational  tone. Sensation: Sensation is intact to light touch throughout Motor: Strength is at least antigravity x4.  Movement examination: Tone: There is normal tone in the UE/LE Abnormal movements:  no tremor.  No myoclonus.  No asterixis.  No tics/abnormal movements noted Coordination:  There is no decremation with RAM's.  F-N is good with eyes closed Gait and Station: The patient has no difficulty arising out of a deep-seated chair without the use of the hands. The patient's stride length is good.       Cc:  , MD

## 2021-03-31 ENCOUNTER — Encounter: Payer: Self-pay | Admitting: Neurology

## 2021-03-31 ENCOUNTER — Ambulatory Visit: Payer: No Typology Code available for payment source | Admitting: Neurology

## 2021-03-31 ENCOUNTER — Other Ambulatory Visit: Payer: Self-pay

## 2021-03-31 VITALS — BP 124/74 | HR 61 | Ht 71.0 in | Wt 150.0 lb

## 2021-03-31 DIAGNOSIS — F959 Tic disorder, unspecified: Secondary | ICD-10-CM | POA: Diagnosis not present

## 2021-03-31 MED ORDER — CLONIDINE HCL 0.1 MG PO TABS: 0.1000 mg | ORAL_TABLET | Freq: Every day | ORAL | 2 refills | Status: DC

## 2021-05-05 ENCOUNTER — Other Ambulatory Visit: Payer: Self-pay | Admitting: Neurology

## 2021-06-22 ENCOUNTER — Encounter: Payer: Self-pay | Admitting: Emergency Medicine

## 2021-06-22 ENCOUNTER — Ambulatory Visit: Payer: No Typology Code available for payment source | Admitting: Emergency Medicine

## 2021-06-22 ENCOUNTER — Other Ambulatory Visit: Payer: Self-pay

## 2021-06-22 DIAGNOSIS — R0602 Shortness of breath: Secondary | ICD-10-CM

## 2021-06-22 NOTE — Patient Instructions (Signed)
We will perform pulmonary function testing in next office visit. Depending on these test results we will decide what other work-up would be appropriate to evaluate short windedness. Follow Dr. Delton Coombes on the same day as your PFT.

## 2021-06-22 NOTE — Assessment & Plan Note (Signed)
New onset shortness of breath over the last 9 months with notable symptoms when laying supine.  Feels like some difficulty moving air especially on inspiration, getting a deep breath.  He also has heard some upper airway noise but only intermittently.  He does have some upper airway mucus that appears to be new.  He has had a normal chest x-ray.  I think the work-up should start with pulmonary function testing.  Depending on this we will decide whether he needs cardiopulmonary exercise test.  Some of this may be upper airway in nature and if so we will try to address potential contributors

## 2021-06-22 NOTE — Progress Notes (Signed)
Subjective:    Patient ID: Manuel Patton, male    DOB: [birth year], 40 y.o.   MRN: [id number]  HPI 40 year old gentleman with a history of former cigar smoking, none currently.  Also with a history of B12 deficiency, depression, some nervous tics on clonidine.  He is referred today for shortness of breath.  He started to experience some dyspnea about 9 months ago.   He goes to the gym, is able to exert. He feels that it is difficult to get a deep breath in, notices it when he is laying supine. He can intermittently hear some UA noise or squeak. Has also noticed some upper airway phlegm, no real PND or congestion. No GERD. He can intermittently feel some chest heaviness. No clear triggers.    CXR 11/04/20 reviewed by me, normal.     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     Family History  Problem Relation Age of Onset   Stroke Maternal Grandmother    Hyperlipidemia Father    Hypertension Other    Hyperlipidemia Other        Only knows that htn and hyperlipidemia run  on his dad's side   Heart attack Neg Hx    Diabetes Neg Hx    Sudden death Neg Hx      Social History   Socioeconomic History   Marital status: Single    Spouse name: Not on file   Number of children: Not on file   Years of education: Not on file   Highest education level: Not on file  Occupational History   Occupation: IT  Tobacco Use   Smoking status: Former    Types: Cigars    Quit date: 05/30/2013    Years since quitting: 8.0   Smokeless tobacco: Never   Tobacco comments:    2 cigars a year  Vaping Use   Vaping Use: Never used  Substance and Sexual Activity   Alcohol use: No    Alcohol/week: 1.0 - 2.0 standard drink    Types: 1 - 2 Standard drinks or equivalent per week    Comment: 1-2 drinks per month   Drug use: No   Sexual activity: Not on file  Other Topics Concern   Not on  file  Social History Narrative   Regular exercise:  Roller blade/skating 2-3 x weekly   Caffeine use:  Occasional tea.   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   Girlfriend died February 25, 2011 in MVA   Right Handed    Social Determinants of Health   Financial Resource Strain: Not on file  Food Insecurity: Not on file  Transportation Needs: Not on file  Physical Activity: Not on file  Stress: Not on file  Social Connections: Not on file  Intimate Partner Violence: Not on file    Works in Consulting civil engineer No military No mold Lived in [place], [place], [place]  Allergies  Allergen Reactions   Aspirin      Outpatient Medications Prior to Visit  Medication Sig Dispense Refill   cloNIDine (CATAPRES) 0.1 MG tablet TAKE 1 TABLET BY MOUTH EVERY DAY 90 tablet 1   ORACEA 40 MG capsule Take 40 mg by mouth daily.     No facility-administered medications prior to visit.         Objective:  Physical Exam Vitals:   06/22/21 1630  BP: 122/68  Pulse: 68  Temp: 98.2 F (36.8 C)  TempSrc: Oral  SpO2: 99%  Weight: 153 lb (69.4 kg)  Height: 5\' 11"  (1.803 m)    Gen: Pleasant, well-nourished, in no distress,  normal affect  ENT: No lesions,  mouth clear,  oropharynx clear, no postnasal drip  Neck: No JVD, no stridor  Lungs: No use of accessory muscles, no crackles or wheezing on normal respiration, no wheeze on forced expiration  Cardiovascular: RRR, heart sounds normal, no murmur or gallops, no peripheral edema  Musculoskeletal: No deformities, no cyanosis or clubbing  Neuro: alert, awake, non focal  Skin: Warm, no lesions or rash      Assessment & Plan:   Shortness of breath New onset shortness of breath over the last 9 months with notable symptoms when laying supine.  Feels like some difficulty moving air especially on inspiration, getting a deep breath.  He also has heard some upper airway noise but only intermittently.  He does have some  upper airway mucus that appears to be new.  He has had a normal chest x-ray.  I think the work-up should start with pulmonary function testing.  Depending on this we will decide whether he needs cardiopulmonary exercise test.  Some of this may be upper airway in nature and if so we will try to address potential contributors    , MD, PhD 06/22/2021, 5:00 PM [facility] [phone] or if no answer before 7:00PM call [phone] For any issues after 7:00PM please call eLink [phone]

## 2021-06-23 NOTE — Addendum Note (Signed)
Addended by: Dorisann Frames R on: 06/23/2021 11:11 AM   Modules accepted: Orders

## 2021-07-28 ENCOUNTER — Ambulatory Visit: Payer: No Typology Code available for payment source | Admitting: Emergency Medicine

## 2021-07-28 ENCOUNTER — Other Ambulatory Visit: Payer: Self-pay

## 2021-07-28 ENCOUNTER — Encounter: Payer: Self-pay | Admitting: Emergency Medicine

## 2021-07-28 DIAGNOSIS — R0602 Shortness of breath: Secondary | ICD-10-CM | POA: Diagnosis not present

## 2021-07-28 LAB — PULMONARY FUNCTION TEST
DL/VA % pred: 106 %
DL/VA: 4.96 ml/min/mmHg/L
DLCO cor % pred: 101 %
DLCO cor: 32.55 ml/min/mmHg
DLCO unc % pred: 101 %
DLCO unc: 32.55 ml/min/mmHg
FEF 25-75 Post: 1.63 L/sec
FEF 25-75 Pre: 3.16 L/sec
FEF2575-%Change-Post: -48 %
FEF2575-%Pred-Post: 39 %
FEF2575-%Pred-Pre: 76 %
FEV1-%Change-Post: -28 %
FEV1-%Pred-Post: 61 %
FEV1-%Pred-Pre: 86 %
FEV1-Post: 2.68 L
FEV1-Pre: 3.76 L
FEV1FVC-%Change-Post: -27 %
FEV1FVC-%Pred-Pre: 90 %
FEV6-%Change-Post: -2 %
FEV6-%Pred-Post: 93 %
FEV6-%Pred-Pre: 96 %
FEV6-Post: 5.03 L
FEV6-Pre: 5.19 L
FEV6FVC-%Change-Post: 0 %
FEV6FVC-%Pred-Post: 101 %
FEV6FVC-%Pred-Pre: 102 %
FVC-%Change-Post: -2 %
FVC-%Pred-Post: 92 %
FVC-%Pred-Pre: 94 %
FVC-Post: 5.08 L
FVC-Pre: 5.19 L
Post FEV1/FVC ratio: 53 %
Post FEV6/FVC ratio: 99 %
Pre FEV1/FVC ratio: 73 %
Pre FEV6/FVC Ratio: 100 %
RV % pred: 80 %
RV: 1.49 L
TLC % pred: 98 %
TLC: 7.03 L

## 2021-07-28 NOTE — Progress Notes (Signed)
Subjective:    Patient ID: Manuel Patton, male    DOB: [birth year], 40 y.o.   MRN: [id number]  HPI 40 year old gentleman with a history of former cigar smoking, none currently.  Also with a history of B12 deficiency, depression, some nervous tics on clonidine.  He is referred today for shortness of breath.  He started to experience some dyspnea about 9 months ago.   He goes to the gym, is able to exert. He feels that it is difficult to get a deep breath in, notices it when he is laying supine. He can intermittently hear some UA noise or squeak. Has also noticed some upper airway phlegm, no real PND or congestion. No GERD. He can intermittently feel some chest heaviness. No clear triggers.   CXR 11/04/20 reviewed by me, normal.    ROV 07/28/21 --40 year old gentleman with a history of cigar smoking (none currently), B12 deficiency, depression.  He was seen a month ago to evaluate shortness of breath worst when he is laying supine with difficulty on inspiration.  Question some upper airway irritation.  He underwent pulmonary function testing today as below. He still feels some SOB esp when supine - hears some UA noise then. Rarely at any other time. He is having more mucous and throat clearing. No GERD.   Pulmonary function testing from today, reviewed by me, shows grossly normal airflows but with possible evidence for mild obstruction.  He had a significant decrease in his FEV1 after albuterol, question bronchospasm or fatigue.  His lung volumes are normal, diffusion capacity is normal.   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     Family History  Problem Relation Age of Onset   Stroke Maternal Grandmother    Hyperlipidemia Father    Hypertension Other    Hyperlipidemia Other        Only knows that htn and hyperlipidemia run  on his dad's side   Heart attack Neg Hx     Diabetes Neg Hx    Sudden death Neg Hx      Social History   Socioeconomic History   Marital status: Single    Spouse name: Not on file   Number of children: Not on file   Years of education: Not on file   Highest education level: Not on file  Occupational History   Occupation: IT  Tobacco Use   Smoking status: Former    Types: Cigars    Quit date: 05/30/2013    Years since quitting: 8.1   Smokeless tobacco: Never   Tobacco comments:    2 cigars a year  Vaping Use   Vaping Use: Never used  Substance and Sexual Activity   Alcohol use: No    Alcohol/week: 1.0 - 2.0 standard drink    Types: 1 - 2 Standard drinks or equivalent per week    Comment: 1-2 drinks per month   Drug use: No   Sexual activity: Not on file  Other Topics Concern   Not on file  Social History Narrative   Regular exercise:  Roller blade/skating 2-3 x weekly   Caffeine use:  Occasional tea.   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   Girlfriend died 2011/02/10 in MVA   Right Handed    Social Determinants of Health   Financial  Resource Strain: Not on file  Food Insecurity: Not on file  Transportation Needs: Not on file  Physical Activity: Not on file  Stress: Not on file  Social Connections: Not on file  Intimate Partner Violence: Not on file    Works in IT No military No mold Lived in [place], [place], [place]  Allergies  Allergen Reactions   Aspirin      Outpatient Medications Prior to Visit  Medication Sig Dispense Refill   cloNIDine (CATAPRES) 0.1 MG tablet TAKE 1 TABLET BY MOUTH EVERY DAY 90 tablet 1   ORACEA 40 MG capsule Take 40 mg by mouth daily.     No facility-administered medications prior to visit.         Objective:   Physical Exam Vitals:   07/28/21 1341  BP: 110/68  Pulse: 70  Temp: 97.9 F (36.6 C)  TempSrc: Oral  SpO2: 99%  Weight: 152 lb (68.9 kg)  Height: 5\' 11"  (1.803 m)    Gen: Pleasant, well-nourished, in no  distress,  normal affect  ENT: No lesions,  mouth clear,  oropharynx clear, no postnasal drip  Neck: No JVD, no stridor  Lungs: No use of accessory muscles, no crackles or wheezing on normal respiration, no wheeze on forced expiration  Cardiovascular: RRR, heart sounds normal, no murmur or gallops, no peripheral edema  Musculoskeletal: No deformities, no cyanosis or clubbing  Neuro: alert, awake, non focal  Skin: Warm, no lesions or rash      Assessment & Plan:   Shortness of breath Grossly normal airflows on his pulmonary function testing although he did have a significant decrease in FEV1 after albuterol.  Significance of this is unclear, question fatigue, question paradoxical bronchospasm.  He does not have any other clinical signs or symptoms of asthma but we could consider a methacholine challenge at some point in the future depending on the work-up.  His shortness of breath happens when he is supine, associated with some upper airway noise, question upper airway instability and irritation.  I think he would benefit from a cardiopulmonary exercise test to ensure no evidence of underlying cardiac or pulmonary contribution.   Time spent 30 minutes  , MD, PhD 07/28/2021, 2:11 PM [facility] [phone] or if no answer before 7:00PM call [phone] For any issues after 7:00PM please call eLink [phone]

## 2021-07-28 NOTE — Patient Instructions (Addendum)
We reviewed your pulmonary function testing today. We will form a cardiopulmonary exercise test. Follow with Dr. Delton Coombes after your testing so that we can review the results together

## 2021-07-28 NOTE — Assessment & Plan Note (Signed)
Grossly normal airflows on his pulmonary function testing although he did have a significant decrease in FEV1 after albuterol.  Significance of this is unclear, question fatigue, question paradoxical bronchospasm.  He does not have any other clinical signs or symptoms of asthma but we could consider a methacholine challenge at some point in the future depending on the work-up.  His shortness of breath happens when he is supine, associated with some upper airway noise, question upper airway instability and irritation.  I think he would benefit from a cardiopulmonary exercise test to ensure no evidence of underlying cardiac or pulmonary contribution.

## 2021-08-09 ENCOUNTER — Other Ambulatory Visit: Payer: Self-pay

## 2021-08-09 ENCOUNTER — Ambulatory Visit (HOSPITAL_COMMUNITY): Payer: No Typology Code available for payment source | Attending: Emergency Medicine

## 2021-08-09 DIAGNOSIS — R0602 Shortness of breath: Secondary | ICD-10-CM | POA: Insufficient documentation

## 2021-11-05 ENCOUNTER — Other Ambulatory Visit: Payer: Self-pay | Admitting: Neurology

## 2021-11-05 DIAGNOSIS — F959 Tic disorder, unspecified: Secondary | ICD-10-CM

## 2021-11-05 DIAGNOSIS — F952 Tourette's disorder: Secondary | ICD-10-CM

## 2021-11-07 ENCOUNTER — Other Ambulatory Visit: Payer: Self-pay

## 2022-02-02 ENCOUNTER — Other Ambulatory Visit: Payer: Self-pay | Admitting: Neurology

## 2022-02-02 DIAGNOSIS — F952 Tourette's disorder: Secondary | ICD-10-CM

## 2022-02-02 DIAGNOSIS — F959 Tic disorder, unspecified: Secondary | ICD-10-CM

## 2022-02-02 NOTE — Telephone Encounter (Signed)
Called patient and was unable to reach. I did deliver the message that we would fill the request this time. If patient would like Dr. Arbutus Leas to prescribe he will need to schedule an appointment. If symptoms are stable he can go through PCP  ?

## 2022-02-02 NOTE — Telephone Encounter (Signed)
OK to give one more refill.  If he would like Dr. Arbutus Leas to continue to prescribe, he will need a follow-up appointment, otherwise OK to request medication through his PCP as symptoms are stable.  ?

## 2022-04-24 NOTE — Progress Notes (Signed)
   Assessment/Plan:   1.  Tics  -Was doing well on clonidine, 0.1 mg daily, but things seem to have worsened over the last few months.  He really does not think it is stress related.  Unfortunately, I really do not think we can increase the clonidine, because his blood pressure is fairly low.  I really worry about him as I think that this is affecting his quality of life.  He does not disagree on that point.  -Discussed other categories of medications, including antidepressants, antipsychotics, antiepileptics.  Ultimately, I think we should start with the antidepressant category of medications.  We discussed risks, benefits, side effects, including sexual side effects.  Patient understood and was agreeable.  We will start with fluoxetine, 20 mg daily.   Subjective:   Manuel Patton was seen today in follow up for tics.  My previous records as well as any outside records available were reviewed prior to todays visit.  Pt is currently on clonidine.  While it helps or did help, he is noting that tics seem to be picking up a bit over the last few months.  He states that i symptoms are intermittent, but it can be bad when he is in public.  It has gotten so bad that he is isolating himself more and declining invitations to go places with friends.  Even out in the front yard, he will notice more tics if neighbors are out.  Stress isn't picking up.  No SE with the clonidine.    Current med: Clonidine: 0.1 mg daily    CURRENT MEDICATIONS:  Outpatient Encounter Medications as of 04/26/2022  Medication Sig   cloNIDine (CATAPRES) 0.1 MG tablet TAKE 1 TABLET BY MOUTH EVERY DAY   ORACEA 40 MG capsule Take 40 mg by mouth daily.   No facility-administered encounter medications on file as of 04/26/2022.     Objective:   PHYSICAL EXAMINATION:    VITALS:   Vitals:   04/26/22 1107  BP: 107/68  Pulse: 71  SpO2: 99%  Weight: 153 lb (69.4 kg)  Height: 5\' 11"  (1.803 m)     GEN:  The patient  appears stated age and is in NAD. HEENT:  Normocephalic, atraumatic.  The mucous membranes are moist. The superficial temporal arteries are without ropiness or tenderness. CV:  RRR Lungs:  CTAB Neck/HEME:  There are no carotid bruits bilaterally.  Neurological examination:  Orientation: The patient is alert and oriented x3. Cranial nerves: There is good facial symmetry.The speech is fluent and clear. Soft palate rises symmetrically and there is no tongue deviation. Hearing is intact to conversational tone. Sensation: Sensation is intact to light touch throughout Motor: Strength is at least antigravity x4.  Movement examination: Tone: There is normal tone in the UE/LE Abnormal movements:  no tremor.  No myoclonus.  No asterixis.  Very few tics are noted in his shoulders. Coordination:  There is no decremation with RAM's.  F-N is good with eyes closed Gait and Station: The patient has no difficulty arising out of a deep-seated chair without the use of the hands. The patient's stride length is good.    Total time spent on today's visit was 25 minutes, including both face-to-face time and nonface-to-face time.  Time included that spent on review of records (prior notes available to me/labs/imaging if pertinent), discussing treatment and goals, answering patient's questions and coordinating care.    Cc:  , MD

## 2022-04-26 ENCOUNTER — Ambulatory Visit: Payer: No Typology Code available for payment source | Admitting: Neurology

## 2022-04-26 ENCOUNTER — Encounter: Payer: Self-pay | Admitting: Neurology

## 2022-04-26 DIAGNOSIS — F959 Tic disorder, unspecified: Secondary | ICD-10-CM | POA: Diagnosis not present

## 2022-04-26 DIAGNOSIS — F952 Tourette's disorder: Secondary | ICD-10-CM

## 2022-04-26 MED ORDER — FLUOXETINE HCL 20 MG PO CAPS: 20.0000 mg | ORAL_CAPSULE | Freq: Every day | ORAL | 1 refills | Status: DC

## 2022-04-26 MED ORDER — CLONIDINE HCL 0.1 MG PO TABS: 0.1000 mg | ORAL_TABLET | Freq: Every day | ORAL | 1 refills | Status: DC

## 2022-07-24 ENCOUNTER — Encounter: Payer: Self-pay | Admitting: *Deleted

## 2022-08-01 NOTE — Progress Notes (Unsigned)
   Assessment/Plan:   1.  Tics  -Continue clonidine, 0.1 mg daily.  Unable to increase because of low blood pressure  -gave him options, including stopping Fluoxetine and changing to another agent.  He was worried about doing that and actually decided to increase it to 40 mg daily.  He will let me know if sexual SE become intolerable.  -do CMP, TSH today  -f/u 4- 14months   Subjective:   Manuel Patton was seen today in follow up for tics.  My previous records as well as any outside records available were reviewed prior to todays visit.  Pt is currently on clonidine and fluoxetine was added to the regimen last visit.  He reports today that it helped some with the tics and his mood but unfortunately caused sexual SE.   He still has trouble with crowds and social situations still and that is significant.  Current med: Clonidine: 0.1 mg daily Fluoxetine, 20 mg daily   CURRENT MEDICATIONS:  Outpatient Encounter Medications as of 08/03/2022  Medication Sig   cloNIDine (CATAPRES) 0.1 MG tablet Take 1 tablet (0.1 mg total) by mouth daily.   FLUoxetine (PROZAC) 20 MG capsule Take 1 capsule (20 mg total) by mouth daily.   ORACEA 40 MG capsule Take 40 mg by mouth daily.   No facility-administered encounter medications on file as of 08/03/2022.     Objective:   PHYSICAL EXAMINATION:    VITALS:   Vitals:   08/03/22 0840  BP: 115/79  Pulse: (!) 56  SpO2: 97%  Weight: 155 lb 6.4 oz (70.5 kg)  Height: 5\' 11"  (1.803 m)      GEN:  The patient appears stated age and is in NAD. HEENT:  Normocephalic, atraumatic.  The mucous membranes are moist. The superficial temporal arteries are without ropiness or tenderness. CV:  RRR Lungs:  CTAB Neck/HEME:  There are no carotid bruits bilaterally.  Neurological examination:  Orientation: The patient is alert and oriented x3. Cranial nerves: There is good facial symmetry.The speech is fluent and clear. Soft palate rises symmetrically and  there is no tongue deviation. Hearing is intact to conversational tone. Sensation: Sensation is intact to light touch throughout Motor: Strength is at least antigravity x4.  Movement examination: Tone: There is normal tone in the UE/LE Abnormal movements:  no tremor.  No myoclonus.  No asterixis.  Very few tics are noted in his shoulders. Coordination:  There is no decremation with RAM's.  F-N is good with eyes closed    Cc:  Vivi Barrack, MD

## 2022-08-03 ENCOUNTER — Ambulatory Visit: Payer: No Typology Code available for payment source | Admitting: Neurology

## 2022-08-03 ENCOUNTER — Other Ambulatory Visit (INDEPENDENT_AMBULATORY_CARE_PROVIDER_SITE_OTHER): Payer: No Typology Code available for payment source

## 2022-08-03 ENCOUNTER — Encounter: Payer: Self-pay | Admitting: Neurology

## 2022-08-03 VITALS — BP 115/79 | HR 56 | Ht 71.0 in | Wt 155.4 lb

## 2022-08-03 DIAGNOSIS — F952 Tourette's disorder: Secondary | ICD-10-CM | POA: Diagnosis not present

## 2022-08-03 DIAGNOSIS — Z5181 Encounter for therapeutic drug level monitoring: Secondary | ICD-10-CM

## 2022-08-03 LAB — COMPREHENSIVE METABOLIC PANEL
ALT: 26 U/L (ref 0–53)
AST: 27 U/L (ref 0–37)
Albumin: 4.5 g/dL (ref 3.5–5.2)
Alkaline Phosphatase: 58 U/L (ref 39–117)
BUN: 13 mg/dL (ref 6–23)
CO2: 32 mEq/L (ref 19–32)
Calcium: 9.5 mg/dL (ref 8.4–10.5)
Chloride: 103 mEq/L (ref 96–112)
Creatinine, Ser: 1.22 mg/dL (ref 0.40–1.50)
GFR: 74.15 mL/min (ref >60.00)
Glucose, Bld: 72 mg/dL (ref 70–99)
Potassium: 4.1 mEq/L (ref 3.5–5.1)
Sodium: 140 mEq/L (ref 135–145)
Total Bilirubin: 0.8 mg/dL (ref 0.2–1.2)
Total Protein: 7 g/dL (ref 6.0–8.3)

## 2022-08-03 LAB — TSH: TSH: 2.45 u[IU]/mL (ref 0.35–5.50)

## 2022-08-03 MED ORDER — FLUOXETINE HCL 40 MG PO CAPS: 40.0000 mg | ORAL_CAPSULE | Freq: Every day | ORAL | 1 refills | Status: DC

## 2022-08-03 NOTE — Patient Instructions (Addendum)
Increase prozac to 40 mg daily  Your provider has requested that you have labwork completed today. The lab is located on the Second floor at [place], within the [facility] office. When you get off the elevator, turn right and go in the [facility] [address]; the first brown door on the left.  Tell the ladies behind the desk that you are there for lab work. If you are not called within 15 minutes please check with the front desk.   Once you complete your labs you are free to go. You will receive a call or message via MyChart with your lab results.    The physicians and staff at [facility] are committed to providing excellent care. You may receive a survey requesting feedback about your experience at our office. We strive to receive "very good" responses to the survey questions. If you feel that your experience would prevent you from giving the office a "very good " response, please contact our office to try to remedy the situation. We may be reached at [phone]. Thank you for taking the time out of your busy day to complete the survey.

## 2022-10-12 ENCOUNTER — Encounter: Payer: Self-pay | Admitting: *Deleted

## 2022-10-22 ENCOUNTER — Other Ambulatory Visit: Payer: Self-pay | Admitting: Neurology

## 2022-10-22 DIAGNOSIS — F959 Tic disorder, unspecified: Secondary | ICD-10-CM

## 2022-10-22 DIAGNOSIS — F952 Tourette's disorder: Secondary | ICD-10-CM

## 2023-01-25 ENCOUNTER — Other Ambulatory Visit: Payer: Self-pay | Admitting: Neurology

## 2023-01-25 DIAGNOSIS — F952 Tourette's disorder: Secondary | ICD-10-CM

## 2023-01-25 DIAGNOSIS — F959 Tic disorder, unspecified: Secondary | ICD-10-CM

## 2023-01-25 NOTE — Progress Notes (Signed)
Assessment/Plan:   1.  Tics  -Continue clonidine, 0.1 mg daily.  Unable to increase because of low blood pressure  -continue Fluoxetine, 40 mg daily  2.  OCD/?bipolar d/o  -following with NP  -told him to call her and let her know unable to tolerate zyprexa  3.  Tremor  -could be from meds in combination with anxiety/stress  4.  F/u 1 year  Subjective:   Manuel Patton was seen today in follow up for tics.  My previous records as well as any outside records available were reviewed prior to todays visit.  Last visit, the patient and I talked about his fluoxetine.  He was having some sexual side effects, and I offered to change that to something else, but he was very worried about that and ultimately wanted to increase the dose rather than changing it.  He reports today that the tics are doing fairly well.  He tells me that he was recently dx with bipolar d/o and was started on zyprexa but it made him too sleepy.  He is seeing a NP.  He also asks about some tremor he is having in hands.  Current med: Clonidine: 0.1 mg daily Fluoxetine, 40 mg daily   CURRENT MEDICATIONS:  Outpatient Encounter Medications as of 01/30/2023  Medication Sig   cloNIDine (CATAPRES) 0.1 MG tablet TAKE 1 TABLET BY MOUTH EVERY DAY   FLUoxetine (PROZAC) 40 MG capsule Take 1 capsule (40 mg total) by mouth daily.   OLANZapine (ZYPREXA) 5 MG tablet Take by mouth.   ORACEA 40 MG capsule Take 40 mg by mouth daily.   No facility-administered encounter medications on file as of 01/30/2023.     Objective:   PHYSICAL EXAMINATION:    VITALS:   Vitals:   01/30/23 0837  BP: 118/80  Pulse: 61  SpO2: 98%  Weight: 164 lb 12.8 oz (74.8 kg)  Height: 5\' 11"  (1.803 m)    GEN:  The patient appears stated age and is in NAD. HEENT:  Normocephalic, atraumatic.  The mucous membranes are moist. The superficial temporal arteries are without ropiness or tenderness.  Neurological examination:  Orientation: The  patient is alert and oriented x3. Cranial nerves: There is good facial symmetry.The speech is fluent and clear. Soft palate rises symmetrically and there is no tongue deviation. Hearing is intact to conversational tone. Sensation: Sensation is intact to light touch throughout Motor: Strength is at least antigravity x4.  Movement examination: Tone: There is normal tone in the UE/LE Abnormal movements:  no rest tremor.  No postural tremor.  Min intention tremor b/l.  No myoclonus.  No asterixis. No tics noted today Coordination:  There is no decremation with RAM's.    I have reviewed and interpreted the following labs independently Lab Results  Component Value Date   WBC 4.7 10/25/2020   HGB 15.0 10/25/2020   HCT 43.7 10/25/2020   MCV 90.4 10/25/2020   PLT 191.0 10/25/2020   Lab Results  Component Value Date   TSH 2.45 08/03/2022     Chemistry      Component Value Date/Time   NA 140 08/03/2022 0928   K 4.1 08/03/2022 0928   CL 103 08/03/2022 0928   CO2 32 08/03/2022 0928   BUN 13 08/03/2022 0928   CREATININE 1.22 08/03/2022 0928   CREATININE 1.14 07/30/2012 0922      Component Value Date/Time   CALCIUM 9.5 08/03/2022 0928   ALKPHOS 58 08/03/2022 0928   AST 27 08/03/2022  0928   ALT 26 08/03/2022 0928   BILITOT 0.8 08/03/2022 [id number]       Cc:  Vivi Barrack, MD

## 2023-01-30 ENCOUNTER — Encounter: Payer: Self-pay | Admitting: Neurology

## 2023-01-30 ENCOUNTER — Ambulatory Visit: Payer: No Typology Code available for payment source | Admitting: Neurology

## 2023-01-30 VITALS — BP 118/80 | HR 61 | Ht 71.0 in | Wt 164.8 lb

## 2023-01-30 DIAGNOSIS — G251 Drug-induced tremor: Secondary | ICD-10-CM | POA: Diagnosis not present

## 2023-01-30 DIAGNOSIS — F959 Tic disorder, unspecified: Secondary | ICD-10-CM | POA: Diagnosis not present

## 2023-01-30 NOTE — Patient Instructions (Signed)
Good to see you today! Call your nurse practitioner about your medication making you sleepy.  The physicians and staff at [facility] are committed to providing excellent care. You may receive a survey requesting feedback about your experience at our office. We strive to receive "very good" responses to the survey questions. If you feel that your experience would prevent you from giving the office a "very good " response, please contact our office to try to remedy the situation. We may be reached at [phone]. Thank you for taking the time out of your busy day to complete the survey.

## 2023-02-14 ENCOUNTER — Other Ambulatory Visit: Payer: Self-pay | Admitting: Neurology

## 2023-05-04 ENCOUNTER — Other Ambulatory Visit: Payer: Self-pay | Admitting: Neurology

## 2023-05-04 DIAGNOSIS — F952 Tourette's disorder: Secondary | ICD-10-CM

## 2023-05-04 DIAGNOSIS — F959 Tic disorder, unspecified: Secondary | ICD-10-CM

## 2023-05-23 ENCOUNTER — Other Ambulatory Visit: Payer: Self-pay | Admitting: Neurology

## 2023-05-23 DIAGNOSIS — F952 Tourette's disorder: Secondary | ICD-10-CM

## 2023-05-23 DIAGNOSIS — F959 Tic disorder, unspecified: Secondary | ICD-10-CM

## 2023-05-23 DIAGNOSIS — G251 Drug-induced tremor: Secondary | ICD-10-CM

## 2023-05-23 DIAGNOSIS — F32A Depression, unspecified: Secondary | ICD-10-CM

## 2023-08-10 ENCOUNTER — Other Ambulatory Visit: Payer: Self-pay | Admitting: Neurology

## 2023-08-10 DIAGNOSIS — F952 Tourette's disorder: Secondary | ICD-10-CM

## 2023-08-10 DIAGNOSIS — F959 Tic disorder, unspecified: Secondary | ICD-10-CM

## 2023-11-01 ENCOUNTER — Encounter: Payer: Self-pay | Admitting: Neurology

## 2023-12-06 ENCOUNTER — Other Ambulatory Visit: Payer: Self-pay | Admitting: Neurology

## 2023-12-06 DIAGNOSIS — F952 Tourette's disorder: Secondary | ICD-10-CM

## 2023-12-06 DIAGNOSIS — F959 Tic disorder, unspecified: Secondary | ICD-10-CM

## 2023-12-28 ENCOUNTER — Encounter: Payer: Self-pay | Admitting: Internal Medicine

## 2023-12-28 ENCOUNTER — Ambulatory Visit (INDEPENDENT_AMBULATORY_CARE_PROVIDER_SITE_OTHER): Payer: No Typology Code available for payment source | Admitting: Internal Medicine

## 2023-12-28 VITALS — BP 121/75 | HR 71 | Temp 98.1°F | Ht 71.0 in | Wt 155.6 lb

## 2023-12-28 DIAGNOSIS — F952 Tourette's disorder: Secondary | ICD-10-CM | POA: Diagnosis not present

## 2023-12-28 DIAGNOSIS — F319 Bipolar disorder, unspecified: Secondary | ICD-10-CM

## 2023-12-28 DIAGNOSIS — Z119 Encounter for screening for infectious and parasitic diseases, unspecified: Secondary | ICD-10-CM | POA: Diagnosis not present

## 2023-12-28 DIAGNOSIS — N182 Chronic kidney disease, stage 2 (mild): Secondary | ICD-10-CM

## 2023-12-28 DIAGNOSIS — E782 Mixed hyperlipidemia: Secondary | ICD-10-CM | POA: Diagnosis not present

## 2023-12-28 DIAGNOSIS — Z0000 Encounter for general adult medical examination without abnormal findings: Secondary | ICD-10-CM | POA: Diagnosis not present

## 2023-12-28 DIAGNOSIS — F429 Obsessive-compulsive disorder, unspecified: Secondary | ICD-10-CM | POA: Insufficient documentation

## 2023-12-28 DIAGNOSIS — F422 Mixed obsessional thoughts and acts: Secondary | ICD-10-CM | POA: Diagnosis not present

## 2023-12-28 LAB — COMPREHENSIVE METABOLIC PANEL
ALT: 24 U/L (ref 0–53)
AST: 24 U/L (ref 0–37)
Albumin: 4.8 g/dL (ref 3.5–5.2)
Alkaline Phosphatase: 54 U/L (ref 39–117)
BUN: 13 mg/dL (ref 6–23)
CO2: 29 meq/L (ref 19–32)
Calcium: 9.6 mg/dL (ref 8.4–10.5)
Chloride: 103 meq/L (ref 96–112)
Creatinine, Ser: 1.21 mg/dL (ref 0.40–1.50)
GFR: 74.15 mL/min (ref >60.00)
Glucose, Bld: 95 mg/dL (ref 70–99)
Potassium: 4.2 meq/L (ref 3.5–5.1)
Sodium: 139 meq/L (ref 135–145)
Total Bilirubin: 0.7 mg/dL (ref 0.2–1.2)
Total Protein: 7.5 g/dL (ref 6.0–8.3)

## 2023-12-28 LAB — CBC WITH DIFFERENTIAL/PLATELET
Basophils Absolute: 0 10*3/uL (ref 0.0–0.1)
Basophils Relative: 0.9 % (ref 0.0–3.0)
Eosinophils Absolute: 0.1 10*3/uL (ref 0.0–0.7)
Eosinophils Relative: 1.1 % (ref 0.0–5.0)
HCT: 44.5 % (ref 39.0–52.0)
Hemoglobin: 15.5 g/dL (ref 13.0–17.0)
Lymphocytes Relative: 24 % (ref 12.0–46.0)
Lymphs Abs: 1.3 10*3/uL (ref 0.7–4.0)
MCHC: 34.8 g/dL (ref 30.0–36.0)
MCV: 92.6 fl (ref 78.0–100.0)
Monocytes Absolute: 0.3 10*3/uL (ref 0.1–1.0)
Monocytes Relative: 6 % (ref 3.0–12.0)
Neutro Abs: 3.6 10*3/uL (ref 1.4–7.7)
Neutrophils Relative %: 68 % (ref 43.0–77.0)
Platelets: 231 10*3/uL (ref 150.0–400.0)
RBC: 4.81 Mil/uL (ref 4.22–5.81)
RDW: 12.5 % (ref 11.5–15.5)
WBC: 5.3 10*3/uL (ref 4.0–10.5)

## 2023-12-28 LAB — LIPID PANEL
Cholesterol: 207 mg/dL — ABNORMAL HIGH (ref 0–200)
HDL: 53.7 mg/dL (ref >39.00)
LDL Cholesterol: 134 mg/dL — ABNORMAL HIGH (ref 0–99)
NonHDL: 152.86
Total CHOL/HDL Ratio: 4
Triglycerides: 93 mg/dL (ref 0.0–149.0)
VLDL: 18.6 mg/dL (ref 0.0–40.0)

## 2023-12-28 LAB — TSH: TSH: 1.91 u[IU]/mL (ref 0.35–5.50)

## 2023-12-28 NOTE — Assessment & Plan Note (Signed)
 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 No new interventions required. Continue current management with the treatment team.

## 2023-12-28 NOTE — Assessment & Plan Note (Signed)
 Previous GFR of 73 in 2023, below normal range. Discussed potential causes including ibuprofen and alcohol use. Advised minimizing nephrotoxic agents. Monitor kidney function with a comprehensive panel.

## 2023-12-28 NOTE — Progress Notes (Signed)
 -- Annual Preventive Medical Office Visit --  Patient:  Manuel Patton      Age: 43 y.o.       Sex:  male  Date:   12/28/2023 Patient Care Team: Lula Olszewski, MD as PCP - General (Internal Medicine) Sharyn Lull, Elvin So, MD as Referring Physician (Dermatology) Rodolph Bong, MD as Consulting Physician (Sports Medicine) Tat, Octaviano Batty, DO as Consulting Physician (Neurology) Today's Healthcare Provider: Lula Olszewski, MD  ========================================= Chief complaint: New Patient (Initial Visit) and Annual Exam  Purpose of Visit: Comprehensive preventive health assessment and personalized health maintenance planning.  This encounter was conducted as a Comprehensive Physical Exam (CPE) preventive care annual visit. The patient's medical history and problem list were reviewed to inform individualized preventive care recommendations. No problem-specific medical treatment was provided during this visit.    Assessment & Plan Encounter for annual health examination Annual physical exam conducted. Discussed regular screenings and preventive care, emphasizing sleep hygiene, diet, exercise, and sinus care. Recommended regular eye and dental exams. Advised on saline nasal spray for sinus care and maintaining a regular sleep schedule with light-based awakening. Discussed dietary recommendations including fruits, vegetables, fiber, extra virgin olive oil, avocado, and fish. Reviewed the importance of cholesterol monitoring. Schedule eye exam every 1-2 years, dental cleanings every 6 months, use saline nasal spray for sinus care, maintain a regular sleep schedule with light-based awakening, incorporate fruits, vegetables, fiber, extra virgin olive oil, avocado, and fish into diet, and check cholesterol levels with a comprehensive panel.  Follow-up Annual physical exam and follow up with the new psychiatrist next week. 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 No new interventions required. Continue current management with the treatment team. Tourette disorder Updated problem overview for this problem to improve longitudinal management   Mixed hyperlipidemia Will order lab testing to guide management.  Bipolar depression (HCC) Currently stable Diagnosed last year at age 38 with significant manic episodes, previously on multiple medications including Wellbutrin, Prozac, Vyvanse, Vraylar, and lithium. Currently well-managed on bupropion but dissatisfied with frequent medication changes and feeling 'zonked'. A new psychiatrist appointment is scheduled next week. Discussed medication adherence, potential need for mood stabilizers, and the role of sleep hygiene. Continue bupropion and follow up with the new psychiatrist next week. 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 Discussed potential causes including ibuprofen and alcohol use. Advised minimizing nephrotoxic agents. Monitor kidney function with a comprehensive panel.  Diagnoses and all orders for this visit: Encounter for annual health examination -     CBC w/Diff -     Comp Met (CMET) -     Lipid panel -     TSH Screening examination for infectious disease -     Hepatitis C Antibody -     HIV antibody (with reflex) Mixed obsessional thoughts and acts Tourette disorder Mixed hyperlipidemia -     Lipid panel Bipolar depression (HCC) Chronic kidney disease (CKD), active medical management without dialysis, stage 2 (mild)  Reviewed/updated/encouraged completion: Immunization History  Administered Date(s) Administered   PFIZER Comirnaty(Gray Top)Covid-19 Tri-Sucrose Vaccine 02/01/2021   PFIZER(Purple Top)SARS-COV-2 Vaccination 02/05/2020, 03/02/2020, 04/05/2020, 02/11/2021   Td  10/30/2004   Tdap 12/30/2014   Health Maintenance Due  Topic Date Due   Hepatitis C Screening  Never done   Health Maintenance  Topic Date Due   Hepatitis C Screening  Never done   DTaP/Tdap/Td (3 - Td or Tdap) 12/29/2024   HIV Screening  Completed   HPV VACCINES  Aged Out   INFLUENZA VACCINE  Discontinued   COVID-19 Vaccine  Discontinued   Today's Health Maintenance Counseling and Anticipatory Guidance:  Eye exams:  every 1-2 years Dental cleanings: every 6 months or more, brush/floss 3x daily Sinus Care: saline spray rinses daily Sleep: 8 hr nightly, good sleep hygiene, If unrestful, use e-monitoring Diet:  fruits/vegetables/fiber/healthy fats, balance and moderation Exercise:  150 minutes/weekly Discouraged any/all high risk behaviors  Today's Cancer Screening Shared Decision Making Discussions: Penile/Testicle/Scrotum: encouraged self-monitoring and reporting of genital abnormalities. Patient reports there are none. Thyroid:  checked and advised to check by palpating thyroid for nodules Prostate:  individualized risks/benefits/costs discussed (No results found for: "PSA") Colon:   Denies strong family history of colon cancer or blood in stool so no screening is indicated until age 46.   Lung:  Current guidelines recommend Individuals aged 57 to 72 who currently smoke or formerly smoked and have a >= 20 pack-year smoking history should undergo annual screening with low-dose computed tomography (LDCT). Skin: Advised regular sunscreen use. He denies worrisome, changing, or new skin lesions. Offered to include images in chart for surveillance. He following with dermatology no lesions on him just freckles. Other: discussed lack of screening guidelines and insurance coverage for other types.    Subjective  HPI Discussed the use of AI scribe software for clinical note transcription with the patient, who gave verbal consent to proceed.  History of Present Illness Manuel Patton  is a 43 year old male with bipolar disorder who presents for an annual physical exam.  He has a history of bipolar disorder, diagnosed last year after a manic phase that significantly disrupted his life, requiring a three to four-month leave from work. He is currently stable on bupropion, having previously tried Wellbutrin, Prozac, Vyvanse, Vraylar, and lithium, which he found challenging due to frequent changes. He is under psychiatric care and plans to see a new psychiatrist next week for further management.  He has Tourette's syndrome, primarily with complex vocal tics, and mixed obsessional thoughts related to OCD. These conditions are managed with treatment from [facility], and he does not require a referral at this time.  He has a history of excessive alcohol consumption last year but has since reduced his intake to below average levels. He denies any use of cigarettes, recreational drugs, or other substances.  A comprehensive lab panel in 2023 revealed a GFR of 73, indicating reduced kidney function. He does not frequently use ibuprofen and has no significant alcohol intake currently. Monitoring of kidney function is planned.  He has regular dermatology check-ups due to fair skin and uses sunblock regularly. No current skin issues. He has no known family history of bipolar disorder, as he is unaware of his father's medical history. He is not married and has no children.   Past Medical History - Skin problems - Joint problems - Obsessive-compulsive disorder - Tourette's - Bipolar disorder diagnosed last year - Kidney disease Medications - Bupropion Social History - Alcohol: previously drank too much, now drinking less than normal - Tobacco: denies smoking - Drugs: denies drug use Family History - Bipolar disorder: mother Review of Systems  Constitutional:  Negative for chills, diaphoresis, fever, malaise/fatigue and weight loss.  HENT:  Negative for congestion, ear discharge, ear pain, hearing  loss, nosebleeds, sinus pain, sore  throat and tinnitus.   Eyes:  Negative for blurred vision, double vision, photophobia, pain, discharge and redness.  Respiratory:  Negative for cough, hemoptysis, sputum production, shortness of breath, wheezing and stridor.   Cardiovascular:  Negative for chest pain, palpitations, orthopnea, claudication, leg swelling and PND.  Gastrointestinal:  Negative for abdominal pain, blood in stool, constipation, diarrhea, heartburn, melena, nausea and vomiting.  Genitourinary:  Negative for dysuria, flank pain, frequency, hematuria and urgency.  Musculoskeletal:  Negative for back pain, falls, joint pain, myalgias and neck pain.  Skin:  Negative for itching and rash.  Neurological:  Negative for dizziness, tingling, tremors, sensory change, speech change, focal weakness, seizures, loss of consciousness, weakness and headaches.  Endo/Heme/Allergies:  Negative for environmental allergies and polydipsia. Does not bruise/bleed easily.  Psychiatric/Behavioral:  Negative for depression, hallucinations, memory loss, substance abuse and suicidal ideas. The patient is not nervous/anxious and does not have insomnia.    Completed medication reconciliation: Current Outpatient Medications on File Prior to Visit  Medication Sig   buPROPion (WELLBUTRIN XL) 150 MG 24 hr tablet Take 150 mg by mouth daily.   cloNIDine (CATAPRES) 0.1 MG tablet TAKE 1 TABLET BY MOUTH EVERY DAY   ORACEA 40 MG capsule Take 40 mg by mouth daily.   No current facility-administered medications on file prior to visit.   Medications Discontinued During This Encounter  Medication Reason   OLANZapine (ZYPREXA) 5 MG tablet    FLUoxetine (PROZAC) 40 MG capsule Completed Course  The following were reviewed and/or entered/updated into our electronic MEDICAL RECORD NUMBERPast Medical History:  Diagnosis Date   Anxiety 01/04/12   Depression 01/04/2012   History of chicken pox    Lactose intolerance    Low testosterone  05/06/2016   Past Surgical History:  Procedure Laterality Date   TONSILLECTOMY AND ADENOIDECTOMY  2000/01/04   WISDOM TOOTH EXTRACTION     Social History   Socioeconomic History   Marital status: Single    Spouse name: Not on file   Number of children: Not on file   Years of education: Not on file   Highest education level: 12th grade  Occupational History   Occupation: IT  Tobacco Use   Smoking status: Former    Types: Cigars    Quit date: 05/30/2013    Years since quitting: 10.5   Smokeless tobacco: Never   Tobacco comments:    2 cigars a year  Vaping Use   Vaping status: Never Used  Substance and Sexual Activity   Alcohol use: Not Currently    Alcohol/week: 1.0 - 2.0 standard drink of alcohol    Types: 1 - 2 Standard drinks or equivalent per week    Comment: 1-2 drinks per month   Drug use: No   Sexual activity: Not Currently  Other Topics Concern   Not on file  Social History Narrative   Regular exercise:  Roller blade/skating 2-3 x weekly   Caffeine use:  Occasional tea.   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   Girlfriend died 01/04/2011 in MVA   Right Handed   One story home   Social Drivers of Health   Financial Resource Strain: Low Risk  (12/24/2023)   Overall Financial Resource Strain (CARDIA)    Difficulty of Paying Living Expenses: Not hard at all  Food Insecurity: No Food Insecurity (12/24/2023)   Hunger Vital Sign    Worried About Running Out of Food in the Last Year:  Never true    Ran Out of Food in the Last Year: Never true  Transportation Needs: No Transportation Needs (12/24/2023)   PRAPARE - Administrator, Civil Service (Medical): No    Lack of Transportation (Non-Medical): No  Physical Activity: Insufficiently Active (12/24/2023)   Exercise Vital Sign    Days of Exercise per Week: 1 day    Minutes of Exercise per Session: 30 min  Stress: Stress Concern Present (12/24/2023)   [employer] of Occupational [employer] - Occupational Stress Questionnaire    Feeling of Stress : Very much  Social Connections: Socially Isolated (12/24/2023)   Social Connection and Isolation Panel [NHANES]    Frequency of Communication with Friends and Family: Once a week    Frequency of Social Gatherings with Friends and Family: Never    Attends Religious Services: Never    Database administrator or Organizations: No    Attends Banker Meetings: Not on file    Marital Status: Never married  Intimate Partner Violence: Not on file      12/24/2023    9:08 AM  Alcohol Use Disorder Test (AUDIT)  1. How often do you have a drink containing alcohol? 2  2. How many drinks containing alcohol do you have on a typical day when you are drinking? 0  3. How often do you have six or more drinks on one occasion? 0  AUDIT-C Score 2      Patient-reported   Family History  Problem Relation Age of Onset   Stroke Maternal Grandmother    Hyperlipidemia Father    Hypertension Other    Hyperlipidemia Other        Only knows that htn and hyperlipidemia run  on his dad's side   Heart attack Neg Hx    Diabetes Neg Hx    Sudden death Neg Hx    Allergies  Allergen Reactions   Aspirin    Social History   Substance and Sexual Activity  Sexual Activity Not Currently   Social History   Tobacco Use   Smoking status: Former    Types: Cigars    Quit date: 05/30/2013    Years since quitting: 10.5   Smokeless tobacco: Never   Tobacco comments:    2 cigars a year  Vaping Use   Vaping status: Never Used  Substance Use Topics   Alcohol use: Not Currently    Alcohol/week: 1.0 - 2.0 standard drink of alcohol    Types: 1 - 2 Standard drinks or equivalent per week    Comment: 1-2 drinks per month   Drug use: No      12/28/2023   10:39 AM  Depression screen PHQ 2/9  Decreased Interest 2  Down, Depressed, Hopeless 1  PHQ - 2 Score 3  Altered sleeping 3  Tired, decreased energy 2  Change  in appetite 2  Feeling bad or failure about yourself  1  Trouble concentrating 3  Moving slowly or fidgety/restless 1  Suicidal thoughts 1  PHQ-9 Score 16  Difficult doing work/chores Somewhat difficult    Objective  BP 121/75   Pulse 71   Temp 98.1 F (36.7 C) (Temporal)   Ht 5\' 11"  (1.803 m)   Wt 155 lb 9.6 oz (70.6 kg)   SpO2 98%   BMI 21.70 kg/m  BP Readings from Last 3 Encounters:  12/28/23 121/75  01/30/23 118/80  08/03/22 115/79   Wt Readings from Last 10 Encounters:  12/28/23  155 lb 9.6 oz (70.6 kg)  01/30/23 164 lb 12.8 oz (74.8 kg)  08/03/22 155 lb 6.4 oz (70.5 kg)  04/26/22 153 lb (69.4 kg)  07/28/21 152 lb (68.9 kg)  06/22/21 153 lb (69.4 kg)  03/31/21 150 lb (68 kg)  11/09/20 151 lb (68.5 kg)  10/25/20 150 lb 9.6 oz (68.3 kg)  11/01/16 161 lb 3.2 oz (73.1 kg)    Physical Exam GEN: No acute distress, resting comfortably. HEENT: Tympanic membranes normal appearing bilaterally, oropharynx clear, no thyromegaly noted, no palpable lymphadenopathy or thyroid nodules. CARDIOVASCULAR: S1 and S2 heart sounds with regular rate and rhythm, no murmurs appreciated. PULMONARY: Normal work of breathing, clear to auscultation bilaterally, no crackles, wheezes, or rhonchi. ABDOMEN: Soft, nontender, nondistended. MSK: No edema, cyanosis, or clubbing noted. SKIN: Warm, dry, no lesions of concern observed. NEUROLOGICAL: Cranial nerves II-XII grossly intact, strength 5/5 in upper and lower extremities, reflexes symmetric and intact bilaterally. PSYCH: Normal affect and thought content, pleasant and cooperative.  Results Reviewed: Last depression screening scores    12/28/2023   10:39 AM 10/25/2020    9:16 AM 11/01/2016    6:28 PM  PHQ 2/9 Scores  PHQ - 2 Score 3 2 3   PHQ- 9 Score 16 8 13    Last fall risk screening    12/28/2023   10:30 AM  Fall Risk   Falls in the past year? 0  Number falls in past yr: 0  Injury with Fall? 0  Risk for fall due to : No Fall Risks   Follow up Falls evaluation completed       ======================================  Notes:  This document was synthesized by artificial intelligence ([employer]bridge) using HIPAA-compliant recording of the clinical interaction;   We discussed the use of AI scribe software for clinical note transcription with the patient, who gave verbal consent to proceed.    This encounter employed state-of-the-art, real-time, collaborative documentation. The patient was empowered to actively review and assist in updating their electronic medical record on a shared monitor, ensuring transparency and improving accuracy.    Prior to and at the beginning of Comprehensive Physical Exam (CPE) preventive care annual visit appointment types  we clarify to patients "Our goal today is to focus on your preventive or annual Comprehensive Physical Exam (CPE) preventive care annual visit, which typically covers routine screenings and overall health maintenance. However, if you share any new or concerning symptoms--such as dizziness, passing out, severe pain, or anything else that may point to a more serious issue--we are both legally and ethically required to evaluate it. We cannot simply overlook or ignore such concerns, even if you later decide you don't want to discuss them, because it could jeopardize your health.  If addressing a new concern takes [place] beyond the scope of the preventive visit, we may need to bill separately for that portion of care. We understand financial considerations are important, and we're happy to discuss your options if something new comes up. However, we want to be clear that once you mention a potentially serious issue, we must investigate it; we can't ethically or legally exclude that from our records or our evaluation. Please let us know all of your questions or worries. Together, we can decide how best to manage them and how to minimize any unexpected costs, but we want to keep you safe above all else."    This disclosure is mandated by professional ethics and legal obligations, as healthcare providers must address any substantial health concerns raised during  any patient interaction and a comprehensive ROS is required by insurance companies for billing preventive-care visit type.    Signed: Lula Olszewski, MD  [facility] [address] [place], [place] [address] Office:  [phone]

## 2023-12-28 NOTE — Assessment & Plan Note (Signed)
 Currently stable Diagnosed last year at age 43 with significant manic episodes, previously on multiple medications including Wellbutrin, Prozac, Vyvanse, Vraylar, and lithium. Currently well-managed on bupropion but dissatisfied with frequent medication changes and feeling 'zonked'. A new psychiatrist appointment is scheduled next week. Discussed medication adherence, potential need for mood stabilizers, and the role of sleep hygiene. Continue bupropion and follow up with the new psychiatrist next week.

## 2023-12-28 NOTE — Assessment & Plan Note (Signed)
Updated problem overview for this problem to improve longitudinal management  

## 2023-12-28 NOTE — Assessment & Plan Note (Signed)
 Will order lab testing to guide management.

## 2023-12-28 NOTE — Patient Instructions (Addendum)
 VISIT SUMMARY:  Manuel Patton, today we conducted your annual physical exam and reviewed your current health status, including your management of bipolar disorder, obsessive-compulsive disorder (OCD), and kidney function. We also discussed general health maintenance and preventive care.  YOUR PLAN:  -BIPOLAR DISORDER: Bipolar disorder is a mental health condition characterized by extreme mood swings, including manic and depressive episodes. You are currently managing well on bupropion but have experienced frequent medication changes. We discussed the importance of medication adherence, the potential need for mood stabilizers, and maintaining good sleep hygiene. Please continue with bupropion and follow up with your new psychiatrist next week.  -OBSESSIVE-COMPULSIVE DISORDER (OCD): OCD is a mental health condition where individuals have recurring, unwanted thoughts (obsessions) and/or repetitive behaviors (compulsions). 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 Continue with your current management plan.  -KIDNEY FUNCTION MONITORING: Your previous lab results showed a GFR of 73, which indicates reduced kidney function. We discussed potential causes, including the use of ibuprofen and alcohol. It is important to minimize the use of substances that can harm your kidneys. We will continue to monitor your kidney function with regular comprehensive lab panels.  -GENERAL HEALTH MAINTENANCE: We reviewed your overall health and preventive care measures. This includes regular screenings, maintaining a healthy diet, exercise, and good sleep hygiene. We also discussed the importance of regular eye and dental exams, using saline nasal spray for sinus care, and incorporating healthy foods like fruits, vegetables, fiber, extra virgin olive oil, avocado, and fish into your diet. Regular monitoring of cholesterol levels is  also recommended.  INSTRUCTIONS:  Please follow up with your new psychiatrist next week for further management of your bipolar disorder. Schedule an eye exam every 1-2 years and dental cleanings every 6 months. Use saline nasal spray for sinus care and maintain a regular sleep schedule with light-based awakening. Incorporate fruits, vegetables, fiber, extra virgin olive oil, avocado, and fish into your diet, and check your cholesterol levels with a comprehensive panel.   Building Your Long-Term Health Plan  During today's preventive visit, we covered a variety of important health checks to help you stay on top of your well-being.  We also discussed strategies to maintain your health and identified some areas that might benefit from further exploration.   Preventive care visits like today's are designed to be proactive, but sometimes additional attention may be needed.  Rest assured, we're here for you.  If these areas require further evaluation or management, we'd be happy to schedule a separate, focused appointment to address them in detail.  Addressing Next Steps  [x]   Follow-up Visit: To ensure we address any unresolved issues and continue monitoring your overall health, we recommend scheduling a follow-up appointment in 1 year for your next preventive care visit. If you experience any new problems, need to discuss any medical concerns, or your condition worsens before then, please don't hesitate to call our office to schedule an appointment or seek emergency care as needed.  [x]   Preventive Measures: Maintaining healthy habits plays a crucial role in overall wellness. We recommend considering these tips: [x]   Regular appointments with dental and vision professionals [x]   Nightly nasal saline mist to keep sinuses clear [x]   Consistent toothbrushing to maintain oral health [x]   Using an app like SnoreLab to track sleep quality [x]   Routine checks of blood pressure and heart rate [x]   Medical  Information: In some instances, we may  require additional medical information from other providers to create a comprehensive picture of your health. If applicable, we can provide a medical information release form at the front desk for you to sign, allowing [place] to gather these records. [x]   Lab Tests: If any lab tests were ordered today, scheduling them within a week of your visit helps ensure the best possible insurance coverage.  Planning Follow Up to Work on a Problem? Make the Most of Our Focused (20 minute) Appointments  [x]   Clearly state your top concerns at the beginning of the visit to focus our discussion [x]   If you anticipate you will need more time, please inform the front desk during scheduling - we can book multiple appointments in the same week. [x]   If you have transportation problems- use our convenient video appointments or ask about transportation support. [x]   We can get down to business faster if you use MyChart to update information before the visit and submit non-urgent questions before your visit. Thank you for taking the time to provide details through MyChart.  Let our nurse know and she can import this information into your encounter documents.  Arrival and Wait Times  [x]   Arriving on time ensures that everyone receives prompt attention. [x]   Early morning (8a) and afternoon (1p) appointments tend to have shortest wait times. [x]   Unfortunately, we cannot delay appointments for late arrivals or hold slots during phone calls.  Bring to Your Next Appointment:  [x]   Medications: Please bring all your medication bottles to your next appointment to ensure we have an accurate record of your prescriptions. [x]   Health Diaries: If you're monitoring any health conditions at home, keeping a diary of your readings can be very helpful for discussions at your next appointment.  Reviewing Your Records  [x]   Review your attached preventive care information at the end of these  patient instructions. [x]   Review this early draft of your clinical encounter notes below and the final encounter summary tomorrow on MyChart after its been completed.      Getting Answers and Following Up  [x]   Simple Questions & Concerns: For quick questions or basic follow-up after your visit, reach [place] at [phone] or MyChart messaging. [x]   Complex Concerns: If your concern is more complex, scheduling an appointment might be best. Discuss this with the staff to find the most suitable option. [x]   Lab & Imaging Results: We'll contact you directly if results are abnormal or you don't use MyChart. Most normal results will be on MyChart within 2-3 business days, with a review message from Dr. Jon Billings. Haven't heard back in 2 weeks? Need results sooner? Contact us at [phone]. [x]   Referrals: Our referral coordinator will manage specialist referrals. The specialist's office should contact you within 2 weeks to schedule an appointment. Call us if you haven't heard from them after 2 weeks.  Staying Connected  [x]   MyChart: Activate your MyChart for the fastest way to access results and message [place]. See the last page of this paperwork for instructions on how to activate.  Billing  [x]   X-ray & Lab Orders: These are billed by separate companies. Contact the invoicing company directly for questions or concerns. [x]   Visit Charges: Discuss any billing inquiries with our administrative services team.  Your Satisfaction Matters  [x]   Share Your Experience: We strive for your satisfaction! If you have any complaints, or preferably compliments, please let Dr. Jon Billings know directly or contact our Practice Administrators, Edwena Felty  or Hasna Boutaib, by asking at the front desk.                 Next Steps  [x]   Schedule Follow-Up:  We recommend a follow-up appointment in 1 year for your next wellness visit.  If you develop any new problems, want to address any medical  issues, or your condition worsens before then, please call us for an appointment or seek emergency care. [x]   Preventive Care:  Make sure to keep regular appointments with dental and vision professionals, use nightly nasal saline mist sprays to keep your sinuses clear and toothbrushing to protect your teeth. Use SnoreLab App or other app to track your sleep quality. Check blood pressure and heart rate routinely. [x]   Medical Information Release:  For any relevant medical information we don't have, please sign a release form at the front desk so we can obtain it for your records. [x]   Lab Tests:  Schedule any lab tests from today for within a week to ensure best insurance coverage.    Making the Most of Our Focused (20 minute) Appointments:  [x]   Clearly state your top concerns at the beginning of the visit to focus our discussion [x]   If you anticipate you will need more time, please inform the front desk during scheduling - we can book multiple appointments in the same week. [x]   If you have transportation problems- use our convenient video appointments or ask about transportation support. [x]   We can get down to business faster if you use MyChart to update information before the visit and submit non-urgent questions before your visit. Thank you for taking the time to provide details through MyChart.  Let our nurse know and she can import this information into your encounter documents.  Arrival and Wait Times: [x]   Arriving on time ensures that everyone receives prompt attention. [x]   Early morning (8a) and afternoon (1p) appointments tend to have shortest wait times. [x]   Unfortunately, we cannot delay appointments for late arrivals or hold slots during phone calls.  Bring to Your Next Appointment  [x]   Medications: Please bring all your medication bottles to your next appointment to ensure we have an accurate record of your prescriptions. [x]   Health Diaries: If you're monitoring any health  conditions at home, keeping a diary of your readings can be very helpful for discussions at your next appointment.  Reviewing Your Records  [x]   Review your attached preventive care information at the end of these patient instructions. [x]   Review this early draft of your clinical encounter notes below and the final encounter summary tomorrow on MyChart after its been completed.   Encounter for annual health examination  Screening examination for infectious disease  Mixed obsessional thoughts and acts  Tourette disorder  Mixed hyperlipidemia     Getting Answers and Following Up  [x]   Simple Questions & Concerns: For quick questions or basic follow-up after your visit, reach [place] at [phone] or MyChart messaging. [x]   Complex Concerns: If your concern is more complex, scheduling an appointment might be best. Discuss this with the staff to find the most suitable option. [x]   Lab & Imaging Results: We'll contact you directly if results are abnormal or you don't use MyChart. Most normal results will be on MyChart within 2-3 business days, with a review message from Dr. Jon Billings. Haven't heard back in 2 weeks? Need results sooner? Contact us at [phone]. [x]   Referrals: Our referral coordinator will manage specialist referrals.  The specialist's office should contact you within 2 weeks to schedule an appointment. Call us if you haven't heard from them after 2 weeks.  Staying Connected  [x]   MyChart: Activate your MyChart for the fastest way to access results and message [place]. See the last page of this paperwork for instructions on how to activate.  Billing  [x]   X-ray & Lab Orders: These are billed by separate companies. Contact the invoicing company directly for questions or concerns. [x]   Visit Charges: Discuss any billing inquiries with our administrative services team.  Your Satisfaction Matters  [x]   Share Your Experience: We strive for your satisfaction! If you have any  complaints, or preferably compliments, please let Dr. Jon Billings know directly or contact our Practice Administrators, Edwena Felty or [employer], by asking at the front desk.     Sinusitis and associated upper respiratory infections:    A sinus infection is soreness and swelling (inflammation) of your sinuses. Sinuses are hollow spaces in the bones around your face. They are located: Around your eyes. In the middle of your forehead. Behind your nose. In your cheekbones. Your sinuses and nasal passages are lined with a fluid called mucus. Mucus drains out of your sinuses. Swelling can trap mucus in your sinuses. This lets germs (bacteria, virus, or fungus) grow, which leads to infection. Most of the time, this condition is caused by a virus.  Chronic allergies can increase swelling in the passages of the sinuses, reducing drainage of mucous and causing infections. What are the causes? Allergies. Asthma. Germs. Things that block your nose or sinuses. Growths in the nose (nasal polyps). Chemicals or irritants in the air. A fungus. This is rare. What increases the risk? Having a weak body defense system (immune system). Doing a lot of swimming or diving. Using nasal sprays too much. Smoking. What are the signs or symptoms? The main symptoms of this condition are pain and a feeling of pressure around the sinuses. Other symptoms include: Stuffy nose (congestion). This may make it hard to breathe through your nose. Runny nose (drainage). Soreness, swelling, and warmth in the sinuses. A cough that may get worse at night. Being unable to smell and taste. Mucus that collects in the throat or the back of the nose (postnasal drip). This may cause a sore throat or bad breath. Being very tired (fatigued). A fever.   How can these infections spread into my ear?  The eustachian tube drains to the sinuses from the mental ear, and can become clogged and infected.   How is this  diagnosed? Your symptoms. Your medical history. A physical exam. Tests to find out if your condition is short-term (acute) or long-term (chronic). Your doctor may: Check your nose for growths (polyps). Check your sinuses using a tool that has a light on one end (endoscope). Check for allergies or germs. Do imaging tests, such as an MRI or CT scan. How is this treated? The most important thing, regardless of the cause, is the sinus rinse & steroid nasal spray combo:  Saline misting nasal spray clears allergens, irritants, mucus from sinuses. Use it every time you feel the need to blow your nose and just before using the steroid nasal spray. This allows the nasal steroids to reach & open Eustachian tubes and reduce symptoms  Tilt head to the side over sink. Insert nozzle into one nostril, depressing on the textured area at the base of the nozzle so a gentle mist fills sinus passages and flows out  nostrils. Repeat in other nostril. Additional Treatment for this condition depends on the cause and whether it is short-term or long-term. If caused by a virus, your symptoms should go away on their own within 10 days. You may be given medicines to relieve symptoms. They include: OTC (available over the counter without a prescription) Medicines like robitussin are great to manage multiple symptom(s)  If its COVID then there is a special medicine for that, but no medications for other specific viruses yet Medicines that treat allergies (antihistamines). Benadryl is strongest antihistamine but very drowsy.  Levocetirizine (xyzal) is the strongest non-drowsy antihistamine.  There are antihistamine eye drops  Over-the-counter pain relievers Decongestants: OTC drugs like Afrin (oxymetazoline) or Sudafed (pseudoephedrine) work by reducing the swelling of blood vessels in the nasal passages and Eustachian tubes.  Never use these medications for more than a few days at a time, especially afrin is  dependency-forming. If this isn't working or you need more than a few days of afrin or sudafed... you should make an appointment.   These kinds of decongestants are less effective in infections than in allergies.  If caused by bacteria, your doctor may wait to see if you will get better without treatment. Consider starting antibiotics only when: Symptoms Last Over 10 Days: If your symptoms persist beyond 10 days, it might be a sign of a bacterial infection. Severe Symptoms: High fever, significant facial pressure, or severe pain could indicate a need for antibiotics. Ear Pain: Pain radiating to your ears can suggest sinus involvement that might benefit from antibiotics Thick Creamy Discharge: Thick, yellow, or green mucus may be a sign of a bacterial infection, whether its discharged from the nose, the eyes, or the throat. Worsening Symptoms: If your symptoms initially improve but then worsen, it's important to get re-evaluated.  Our Approach to Your Care: Starting with Non-Antibiotics: In most cases, we can manage sinus infections with rest, over-the-counter pain relievers, and home remedies like nasal irrigation. This helps [place] avoid unnecessary antibiotics. Usually, an infection can be cleared with good sinus hygiene without the risk of antibiotics. Monitoring Your Progress: Let's keep in touch about your symptoms. If they worsen or meet the criteria listed above, please contact us right away. Completing the Course: If antibiotics become necessary, it's crucial to finish the entire prescription to ensure complete healing. Supporting Gut Health: Probiotics can be helpful alongside antibiotics to maintain healthy gut bacteria.  Yogurt is cheaper than the pills.  If caused by growths in the nose, surgery may be needed.  Follow these instructions at home: Medicines Take, use, or apply over-the-counter and prescription medicines only as told by your doctor. These may include nasal sprays. If you  were prescribed an antibiotic medicine, take it as told by your doctor. Do not stop taking it even if you start to feel better. Hydrate and humidify  Drink enough water to keep your pee (urine) pale yellow. Use a cool mist humidifier to keep the humidity level in your home above 50%. Breathe in steam for 10-15 minutes, 3-4 times a day, or as told by your doctor. You can do this in the bathroom while a hot shower is running. Try not to spend time in cool or dry air. Decongest and rinse out your sinuses with daily SimplySaline gentle misting spray- apply any medicated sinus sprays after using this misting to blow your nose. Rest Rest as much as you can. Sleep with your head raised (elevated). Make sure you get enough sleep each  night. General instructions  Put a warm, moist washcloth on your face 3-4 times a day, or as often as told by your doctor. Use nasal saline washes as often as told by your doctor. Wash your hands often with soap and water. If you cannot use soap and water, use hand sanitizer. Do not smoke. Avoid being around people who are smoking (secondhand smoke). Keep all follow-up visits. Contact a doctor if: You have a fever. Your symptoms get worse. Your symptoms do not get better within 10 days. Get help right away if: You have a very bad headache. You cannot stop vomiting. You have very bad pain or swelling around your face or eyes. You have trouble seeing. You feel confused. Your neck is stiff. You have trouble breathing. These symptoms may be an emergency. Get help right away. Call 911. Do not wait to see if the symptoms will go away. Do not drive yourself to the hospital. Summary A sinus infection is swelling of your sinuses. Sinuses are hollow spaces in the bones around your face. This condition is caused by tissues in your nose that become inflamed or swollen. This traps germs. These can lead to infection. Daily sinus rinsing can reduce your risk for these. If  you were prescribed an antibiotic medicine, take it as told by your doctor. Do not stop taking it even if you start to feel better. Keep all follow-up visits.

## 2023-12-29 LAB — HEPATITIS C ANTIBODY: Hepatitis C Ab: NONREACTIVE

## 2023-12-29 LAB — HIV ANTIBODY (ROUTINE TESTING W REFLEX): HIV 1&2 Ab, 4th Generation: NONREACTIVE

## 2023-12-30 ENCOUNTER — Encounter: Payer: Self-pay | Admitting: Internal Medicine

## 2023-12-30 NOTE — Progress Notes (Signed)
 I've reviewed the results and they are all essentially normal, except for cholesterol levels.  HDL Cholesterol was 53.70 4: this is good cholesterol we want to raise the levels as high as possible.  The longest lived individuals typically have levels over 100. LDL cholesterol levels were LDLcalc 134 this is bad cholesterol that forms plaques in the arteries that cause strokes and heart attacks, we want the levels as low as possible.  People with history of cardiovascular disease take medicine to get to a goal of 50, but lower might be even better. Triglycerides Levels were 93.0 these contribute to cardiovascular disease and gallstones, we want the levels as low as possible.  Low carb diets and weight loss are very effective at keeping this low.  While we'll discuss the details at your next appointment, we wanted to share some initial thoughts and next steps.  Diet: Focus on avoiding foods that have saturated fats, high cholesterol, or high levels of carbohydrates like sugar, starch, and grains.  Even more important, be sure to eat lots of healthy omega-3 unsaturated fats like Extra Virgin Olive Oil, fatty fish, nuts, and avocados.  Its safe to take omega-3 supplements. Exercise: Regular physical activity is crucial for overall health and can benefit your cholesterol. Weight Management: If you're overweight or obese, reaching a healthy weight can positively impact your cholesterol. Potential Medication: Depending on your individual risk factors, we might explore the benefits of medications to further manage your cholesterol and heart health.  We'll discuss this in detail at your next visit.  Collaboration is Key: We value your active participation in your care plan. At your next appointment, we can discuss your questions and preferences to create a personalized approach that works best for you.  Next Steps: We encourage you to make any suggested lifestyle changes as described above We encourage you  to get cholesterol rechecked in 6-12 months If you're interested, we could have an extra appointment to: (1) Review your cholesterol results in more detail. (2) Discuss personalized lifestyle changes and potential medication options. (3) Answer your questions and address any concerns you may have. (4) Discuss additional cholesterol testing options and cardiac risk evaluation tests that are available  In the meantime, you can find some resources on heart-healthy living at [email or web address] Please let us know if you have any questions before your next appointment.

## 2024-01-29 NOTE — Progress Notes (Unsigned)
 Assessment/Plan:   1.  Tics  -Currently well-controlled  -Continue clonidine, 0.1 mg daily.    -Was on fluoxetine, but now off.  2.  OCD/?bipolar d/o  -following with NP  -On Zyprexa, which may be helping #1 as well.  3.  Follow-up 1 year  Subjective:   Manuel Patton was seen today in follow up for tics.  My previous records as well as any outside records available were reviewed prior to todays visit.  Last visit, the patient and I talked about his fluoxetine.  Patient remains on clonidine.   he last saw primary care February 28.  Notes are reviewed.  Separately, he has been seeing a Publishing rights manager for psychiatry.  Last visit, he had just started on olanzapine, but it was making him very sleepy and he ended up stopping it.  He does not even remember that, but I went back and looked at pharmacy records and he was on olanzapine in March, 2024.  He states that he was actually put on a number of medications last year, including Vyvanse, Vraylar, lithium and sertraline.  He got frustrated with all the medications and actually stopped them all, including the fluoxetine.  He did not realize that I was the one prescribing his fluoxetine.  He ended up changing psychiatry practitioners and is now going to [place] [facility] for [facility].  He was just again started on the olanzapine.  It is making him sleepy and he has only been on it for about 30 days.  He overall thinks he is starting to do better, however.  Reports that his blood pressure is good.  He is not having money to fix.  He states that he was getting manic over the last year, but is much better now.  Current med: Clonidine: 0.1 mg daily Fluoxetine, 40 mg daily  Prior medications: Zyprexa (given by psychiatric nurse practitioner and made him sleepy) CURRENT MEDICATIONS:  Outpatient Encounter Medications as of 01/30/2024  Medication Sig   buPROPion (WELLBUTRIN XL) 150 MG 24 hr tablet Take 150 mg by mouth daily.   OLANZapine  (ZYPREXA) 5 MG tablet Take 5 mg by mouth daily.   ORACEA 40 MG capsule Take 40 mg by mouth daily.   [DISCONTINUED] cloNIDine (CATAPRES) 0.1 MG tablet TAKE 1 TABLET BY MOUTH EVERY DAY   cloNIDine (CATAPRES) 0.1 MG tablet Take 1 tablet (0.1 mg total) by mouth daily.   No facility-administered encounter medications on file as of 01/30/2024.     Objective:   PHYSICAL EXAMINATION:    VITALS:   Vitals:   01/30/24 1129  BP: 120/80  Pulse: 69  SpO2: 98%  Weight: 162 lb 9.6 oz (73.8 kg)  Height: 5\' 11"  (1.803 m)     GEN:  The patient appears stated age and is in NAD. HEENT:  Normocephalic, atraumatic.  The mucous membranes are moist. The superficial temporal arteries are without ropiness or tenderness.  Neurological examination:  Orientation: The patient is alert and oriented x3. Cranial nerves: There is good facial symmetry.The speech is fluent and clear. Soft palate rises symmetrically and there is no tongue deviation. Hearing is intact to conversational tone. Sensation: Sensation is intact to light touch throughout Motor: Strength is 5/5 in the bilateral upper and lower extremities.  Movement examination: Tone: There is normal tone in the UE/LE Abnormal movements:  no rest tremor.  No postural tremor.  Min intention tremor b/l.  No myoclonus.  No asterixis. No tics noted today Coordination:  There  is no decremation with RAM's.    I have reviewed and interpreted the following labs independently Lab Results  Component Value Date   WBC 5.3 12/28/2023   HGB 15.5 12/28/2023   HCT 44.5 12/28/2023   MCV 92.6 12/28/2023   PLT 231.0 12/28/2023   Lab Results  Component Value Date   TSH 1.91 12/28/2023     Chemistry      Component Value Date/Time   NA 139 12/28/2023 1131   K 4.2 12/28/2023 1131   CL 103 12/28/2023 1131   CO2 29 12/28/2023 1131   BUN 13 12/28/2023 1131   CREATININE 1.21 12/28/2023 1131   CREATININE 1.14 07/30/2012 0922      Component Value Date/Time    CALCIUM 9.6 12/28/2023 1131   ALKPHOS 54 12/28/2023 1131   AST 24 12/28/2023 1131   ALT 24 12/28/2023 1131   BILITOT 0.7 12/28/2023 1131       Cc:  Lula Olszewski, MD

## 2024-01-30 ENCOUNTER — Ambulatory Visit: Payer: No Typology Code available for payment source | Admitting: Neurology

## 2024-01-30 ENCOUNTER — Encounter: Payer: Self-pay | Admitting: Neurology

## 2024-01-30 DIAGNOSIS — F959 Tic disorder, unspecified: Secondary | ICD-10-CM | POA: Diagnosis not present

## 2024-01-30 DIAGNOSIS — F952 Tourette's disorder: Secondary | ICD-10-CM | POA: Diagnosis not present

## 2024-01-30 MED ORDER — CLONIDINE HCL 0.1 MG PO TABS: 0.1000 mg | ORAL_TABLET | Freq: Every day | ORAL | 2 refills | Status: AC

## 2024-02-26 ENCOUNTER — Ambulatory Visit: Payer: No Typology Code available for payment source | Admitting: Neurology

## 2024-11-06 ENCOUNTER — Telehealth: Payer: Self-pay | Admitting: Neurology

## 2024-11-06 NOTE — Telephone Encounter (Signed)
 Pt stated symptoms worsening exponentially and would like a call back for assistance while awaiting appt, added him to waitlist

## 2024-11-07 NOTE — Telephone Encounter (Signed)
 Called patient. He feels like his symptoms have gotten so much worse since September. He is unable to control the tics in public and has even bite his mouth and cut his lip. The Clonidine  is not helping any longer and he let me know that he has been trying to manage symptoms on his own and now he feels he can no longer control

## 2024-11-10 NOTE — Telephone Encounter (Signed)
 Called patient and he would like to think about it and call me back

## 2024-12-30 ENCOUNTER — Encounter: Payer: No Typology Code available for payment source | Admitting: Internal Medicine

## 2025-01-29 ENCOUNTER — Ambulatory Visit: Admitting: Neurology
